# Patient Record
Sex: Female | Born: 1990 | Race: Black or African American | Hispanic: No | Marital: Single | Smoking: Never smoker
Health system: Southern US, Community
[De-identification: ages and names within clinical notes are randomized; demographics above are authoritative.]

## PROBLEM LIST (undated history)

## (undated) ENCOUNTER — Inpatient Hospital Stay (HOSPITAL_COMMUNITY): Payer: Self-pay

## (undated) DIAGNOSIS — G43909 Migraine, unspecified, not intractable, without status migrainosus: Secondary | ICD-10-CM

## (undated) HISTORY — PX: TONSILLECTOMY: SUR1361

## (undated) HISTORY — PX: ECTOPIC PREGNANCY SURGERY: SHX613

---
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
## 2011-01-16 ENCOUNTER — Emergency Department (HOSPITAL_BASED_OUTPATIENT_CLINIC_OR_DEPARTMENT_OTHER)
Admission: EM | Admit: 2011-01-16 | Discharge: 2011-01-16 | Disposition: A | Discharge status: Home / Self Care | Payer: Medicaid Other | Attending: Emergency Medicine | Admitting: Emergency Medicine

## 2011-01-16 ENCOUNTER — Emergency Department (INDEPENDENT_AMBULATORY_CARE_PROVIDER_SITE_OTHER): Payer: Medicaid Other

## 2011-01-16 DIAGNOSIS — R112 Nausea with vomiting, unspecified: Secondary | ICD-10-CM

## 2011-01-16 DIAGNOSIS — R109 Unspecified abdominal pain: Secondary | ICD-10-CM

## 2011-01-16 DIAGNOSIS — J45909 Unspecified asthma, uncomplicated: Secondary | ICD-10-CM | POA: Insufficient documentation

## 2011-01-16 DIAGNOSIS — G8929 Other chronic pain: Secondary | ICD-10-CM | POA: Insufficient documentation

## 2011-01-16 LAB — URINALYSIS, ROUTINE W REFLEX MICROSCOPIC
Glucose, UA: NEGATIVE mg/dL
Leukocytes, UA: NEGATIVE
Protein, ur: NEGATIVE mg/dL
Specific Gravity, Urine: 1.03 (ref 1.005–1.030)
Urobilinogen, UA: 0.2 mg/dL (ref 0.0–1.0)

## 2011-01-16 LAB — CBC
MCH: 27.7 pg (ref 26.0–34.0)
MCHC: 34.7 g/dL (ref 30.0–36.0)
Platelets: 280 10*3/uL (ref 150–400)
RDW: 12.6 % (ref 11.5–15.5)

## 2011-01-16 LAB — COMPREHENSIVE METABOLIC PANEL
ALT: 9 U/L (ref 0–35)
AST: 15 U/L (ref 0–37)
Calcium: 9.8 mg/dL (ref 8.4–10.5)
Creatinine, Ser: <0.47 mg/dL (ref 0.4–1.2)
Sodium: 139 mEq/L (ref 135–145)
Total Protein: 8.1 g/dL (ref 6.0–8.3)

## 2011-01-16 LAB — DIFFERENTIAL
Basophils Absolute: 0 10*3/uL (ref 0.0–0.1)
Basophils Relative: 0 % (ref 0–1)
Eosinophils Absolute: 0.1 10*3/uL (ref 0.0–0.7)
Eosinophils Relative: 1 % (ref 0–5)
Monocytes Absolute: 0.4 10*3/uL (ref 0.1–1.0)
Monocytes Relative: 6 % (ref 3–12)
Neutro Abs: 4.9 10*3/uL (ref 1.7–7.7)

## 2011-01-16 LAB — URINE MICROSCOPIC-ADD ON

## 2011-04-02 ENCOUNTER — Encounter: Payer: Self-pay | Admitting: *Deleted

## 2011-04-02 ENCOUNTER — Emergency Department (HOSPITAL_BASED_OUTPATIENT_CLINIC_OR_DEPARTMENT_OTHER)
Admission: EM | Admit: 2011-04-02 | Discharge: 2011-04-02 | Disposition: A | Discharge status: Home / Self Care | Payer: Medicaid Other | Attending: Emergency Medicine | Admitting: Emergency Medicine

## 2011-04-02 DIAGNOSIS — B373 Candidiasis of vulva and vagina: Secondary | ICD-10-CM | POA: Insufficient documentation

## 2011-04-02 DIAGNOSIS — A499 Bacterial infection, unspecified: Secondary | ICD-10-CM | POA: Insufficient documentation

## 2011-04-02 DIAGNOSIS — R350 Frequency of micturition: Secondary | ICD-10-CM | POA: Insufficient documentation

## 2011-04-02 DIAGNOSIS — B3731 Acute candidiasis of vulva and vagina: Secondary | ICD-10-CM | POA: Insufficient documentation

## 2011-04-02 DIAGNOSIS — N390 Urinary tract infection, site not specified: Secondary | ICD-10-CM

## 2011-04-02 DIAGNOSIS — N760 Acute vaginitis: Secondary | ICD-10-CM | POA: Insufficient documentation

## 2011-04-02 DIAGNOSIS — B9689 Other specified bacterial agents as the cause of diseases classified elsewhere: Secondary | ICD-10-CM | POA: Insufficient documentation

## 2011-04-02 LAB — WET PREP, GENITAL

## 2011-04-02 LAB — URINALYSIS, ROUTINE W REFLEX MICROSCOPIC
Glucose, UA: NEGATIVE mg/dL
Hgb urine dipstick: NEGATIVE
Ketones, ur: 15 mg/dL — AB
Protein, ur: NEGATIVE mg/dL
Urobilinogen, UA: 0.2 mg/dL (ref 0.0–1.0)

## 2011-04-02 LAB — URINE MICROSCOPIC-ADD ON

## 2011-04-02 MED ORDER — METRONIDAZOLE 500 MG PO TABS: 500.0000 mg | ORAL_TABLET | Freq: Two times a day (BID) | ORAL | Status: AC

## 2011-04-02 MED ORDER — FLUCONAZOLE 200 MG PO TABS: 200.0000 mg | ORAL_TABLET | Freq: Every day | ORAL | Status: AC

## 2011-04-02 MED ORDER — NITROFURANTOIN MONOHYD MACRO 100 MG PO CAPS: 100.0000 mg | ORAL_CAPSULE | Freq: Two times a day (BID) | ORAL | Status: AC

## 2011-04-02 NOTE — ED Provider Notes (Addendum)
History     CSN: [id number] Arrival date & time: 04/02/2011  2:57 PM  Chief Complaint  Patient presents with  . Urinary Frequency   HPI Comments: Pt states that she is having urinary frequency and white vaginal discharge  Patient is a 20 y.o. female presenting with frequency. The history is provided by the patient. No language interpreter was used.  Urinary Frequency This is a new problem. The current episode started yesterday. The problem occurs constantly. The problem has been unchanged. Associated symptoms include urinary symptoms. Pertinent negatives include no abdominal pain, fever, nausea or sore throat. The symptoms are aggravated by nothing. She has tried nothing for the symptoms.  Urinary Frequency This is a new problem. The current episode started yesterday. The problem occurs constantly. The problem has been unchanged. Pertinent negatives include no abdominal pain. The symptoms are aggravated by nothing. She has tried nothing for the symptoms.    Past Medical History  Diagnosis Date  . Arthritis     Past Surgical History  Procedure Date  . Tonsillectomy     No family history on file.  History  Substance Use Topics  . Smoking status: Not on file  . Smokeless tobacco: Not on file  . Alcohol Use:     OB History    Grav Para Term Preterm Abortions TAB SAB Ect Mult Living                  Review of Systems  Constitutional: Negative for fever.  HENT: Negative for sore throat.   Gastrointestinal: Negative for nausea and abdominal pain.  Genitourinary: Positive for frequency.  All other systems reviewed and are negative.    Physical Exam  BP 131/71  Pulse 84  Temp(Src) 98.5 F (36.9 C) (Oral)  Resp 16  Ht 5\' 2"  (1.575 m)  Wt 104 lb (47.174 kg)  BMI 19.02 kg/m2  SpO2 98%  LMP 03/23/2011  Physical Exam  Vitals reviewed. Constitutional: She is oriented to person, place, and time. She appears well-developed and well-nourished.  HENT:  Head:  Normocephalic.  Cardiovascular: Normal rate and regular rhythm.   Pulmonary/Chest: Effort normal and breath sounds normal.  Abdominal: Soft. Bowel sounds are normal.  Genitourinary: Cervix exhibits no motion tenderness. Vaginal discharge found.       White vaginal discharge  Musculoskeletal: Normal range of motion.  Neurological: She is alert and oriented to person, place, and time.  Skin: Skin is warm and dry.  Psychiatric: She has a normal mood and affect.    ED Course  Procedures Results for orders placed during the hospital encounter of 04/02/11  URINALYSIS, ROUTINE W REFLEX MICROSCOPIC      Component Value Range   Color, Urine YELLOW  YELLOW    Appearance CLOUDY (*) CLEAR    Specific Gravity, Urine 1.025  1.005 - 1.030    pH 6.0  5.0 - 8.0    Glucose, UA NEGATIVE  NEGATIVE (mg/dL)   Hgb urine dipstick NEGATIVE  NEGATIVE    Bilirubin Urine NEGATIVE  NEGATIVE    Ketones, ur 15 (*) NEGATIVE (mg/dL)   Protein, ur NEGATIVE  NEGATIVE (mg/dL)   Urobilinogen, UA 0.2  0.0 - 1.0 (mg/dL)   Nitrite NEGATIVE  NEGATIVE    Leukocytes, UA MODERATE (*) NEGATIVE   PREGNANCY, URINE      Component Value Range   Preg Test, Ur NEGATIVE    WET PREP, GENITAL      Component Value Range   Yeast, Wet Prep RARE (*)  NONE SEEN    Trich, Wet Prep NONE SEEN  NONE SEEN    Clue Cells, Wet Prep FEW (*) NONE SEEN    WBC, Wet Prep HPF POC FEW (*) NONE SEEN   URINE MICROSCOPIC-ADD ON      Component Value Range   Squamous Epithelial / LPF FEW (*) RARE    WBC, UA 7-10  <3 (WBC/hpf)   Bacteria, UA RARE  RARE    No results found.  MDM Pt afebrile, with vitals stable:will treat for a simple uti, bv and yeast      Teressa Lower, NP 04/02/11 1620  Teressa Lower, NP 04/02/11 1621  Medical screening examination/treatment/procedure(s) were conducted as a shared visit with non-physician practitioner(s) and myself.     Sunnie Nielsen, MD 04/02/11 2138  Sunnie Nielsen, MD 04/30/11 [phone]

## 2011-04-02 NOTE — ED Notes (Signed)
Pt reports burning, itching and white discharge x 2 days. Also wants to be checked for STD.

## 2011-04-04 LAB — GC/CHLAMYDIA PROBE AMP, GENITAL
Chlamydia, DNA Probe: POSITIVE — AB
GC Probe Amp, Genital: NEGATIVE

## 2011-04-05 NOTE — ED Notes (Signed)
Results received from [facility].  (+) Chlamydia.  No antibiotic treatment or Prescription given for STD.  Chart to MD office for review.  [facility] form attached. 

## 2011-08-22 ENCOUNTER — Emergency Department (HOSPITAL_BASED_OUTPATIENT_CLINIC_OR_DEPARTMENT_OTHER)
Admission: EM | Admit: 2011-08-22 | Discharge: 2011-08-22 | Disposition: A | Discharge status: Home / Self Care | Payer: Self-pay | Attending: Emergency Medicine | Admitting: Emergency Medicine

## 2011-08-22 ENCOUNTER — Encounter (HOSPITAL_BASED_OUTPATIENT_CLINIC_OR_DEPARTMENT_OTHER): Payer: Self-pay | Admitting: *Deleted

## 2011-08-22 DIAGNOSIS — B9789 Other viral agents as the cause of diseases classified elsewhere: Secondary | ICD-10-CM | POA: Insufficient documentation

## 2011-08-22 DIAGNOSIS — J029 Acute pharyngitis, unspecified: Secondary | ICD-10-CM | POA: Insufficient documentation

## 2011-08-22 DIAGNOSIS — B349 Viral infection, unspecified: Secondary | ICD-10-CM

## 2011-08-22 DIAGNOSIS — J45909 Unspecified asthma, uncomplicated: Secondary | ICD-10-CM | POA: Insufficient documentation

## 2011-08-22 MED ORDER — HYDROCOD POLST-CHLORPHEN POLST 10-8 MG/5ML PO LQCR
5.0000 mL | Freq: Two times a day (BID) | ORAL | Status: DC | PRN
Start: 2011-08-22 — End: 2012-01-01

## 2011-08-22 NOTE — ED Provider Notes (Signed)
History     CSN: [id number]  Arrival date & time 08/22/11  1239   First MD Initiated Contact with Patient 08/22/11 1406      Chief Complaint  Patient presents with  . Influenza    (Consider location/radiation/quality/duration/timing/severity/associated sxs/prior treatment) Patient is a 20 y.o. female presenting with flu symptoms. The history is provided by the patient. No language interpreter was used.  Influenza This is a new problem. The current episode started today. The problem occurs constantly. The problem has been unchanged. Associated symptoms include coughing and a sore throat. Pertinent negatives include no fever, rash or vomiting. The symptoms are aggravated by nothing. She has tried nothing for the symptoms.    Past Medical History  Diagnosis Date  . Asthma     Past Surgical History  Procedure Date  . Tonsillectomy     History reviewed. No pertinent family history.  History  Substance Use Topics  . Smoking status: Never Smoker   . Smokeless tobacco: Not on file  . Alcohol Use: No    OB History    Grav Para Term Preterm Abortions TAB SAB Ect Mult Living                  Review of Systems  Constitutional: Negative for fever.  HENT: Positive for sore throat.   Respiratory: Positive for cough.   Gastrointestinal: Negative for vomiting.  Skin: Negative for rash.  All other systems reviewed and are negative.    Allergies  Penicillins  Home Medications   Current Outpatient Rx  Name Route Sig Dispense Refill  . ALBUTEROL 90 MCG/ACT IN AERS Inhalation Inhale 2 puffs into the lungs once as needed. For shortness of breath     . HYDROCOD POLST-CHLORPHEN POLST 10-8 MG/5ML PO LQCR Oral Take 5 mLs by mouth every 12 (twelve) hours as needed. 140 mL 0  . CLINDAMYCIN HCL 150 MG PO CAPS Oral Take 150 mg by mouth 3 (three) times daily.      Azzie Roup ACE-ETH ESTRAD-FE 1-20 MG-MCG(24) PO TABS Oral Take 1 tablet by mouth daily.        BP 145/87  Pulse 103   Temp(Src) 98.2 F (36.8 C) (Oral)  Resp 18  Ht 5\' 1"  (1.549 m)  Wt 105 lb (47.628 kg)  BMI 19.84 kg/m2  SpO2 100%  LMP 08/01/2011  Physical Exam  Nursing note and vitals reviewed. Constitutional: She is oriented to person, place, and time. She appears well-developed and well-nourished.  HENT:  Head: Normocephalic and atraumatic.  Right Ear: External ear normal.  Left Ear: External ear normal.  Nose: Rhinorrhea present.  Mouth/Throat: Posterior oropharyngeal erythema present.  Eyes: Conjunctivae and EOM are normal.  Cardiovascular: Normal rate and regular rhythm.   Pulmonary/Chest: Effort normal and breath sounds normal.  Musculoskeletal: Normal range of motion.  Neurological: She is alert and oriented to person, place, and time.    ED Course  Procedures (including critical care time)   Labs Reviewed  PREGNANCY, URINE   No results found.   1. Viral illness       MDM  Lungs clear:symptom likely viral:will treat with cough medication:don't think antibiotics or imaging are needed at this time        Teressa Lower, NP 08/22/11 1445

## 2011-08-22 NOTE — ED Notes (Signed)
Care plan and follow up hydration reviewed

## 2011-08-22 NOTE — ED Notes (Signed)
Pt states she has had cough, H/A, sore throat, chest "burns", SHOB, eyes watery x 3 days.

## 2011-08-23 NOTE — ED Provider Notes (Signed)
Medical screening examination/treatment/procedure(s) were performed by non-physician practitioner and as supervising physician I was immediately available for consultation/collaboration.  Nelma Phagan, MD 08/23/11 2211 

## 2011-11-13 ENCOUNTER — Encounter (HOSPITAL_BASED_OUTPATIENT_CLINIC_OR_DEPARTMENT_OTHER): Payer: Self-pay

## 2011-11-13 ENCOUNTER — Emergency Department (HOSPITAL_BASED_OUTPATIENT_CLINIC_OR_DEPARTMENT_OTHER)
Admission: EM | Admit: 2011-11-13 | Discharge: 2011-11-13 | Disposition: A | Discharge status: Home / Self Care | Payer: Self-pay | Attending: Emergency Medicine | Admitting: Emergency Medicine

## 2011-11-13 DIAGNOSIS — J45909 Unspecified asthma, uncomplicated: Secondary | ICD-10-CM | POA: Insufficient documentation

## 2011-11-13 DIAGNOSIS — N939 Abnormal uterine and vaginal bleeding, unspecified: Secondary | ICD-10-CM

## 2011-11-13 DIAGNOSIS — N898 Other specified noninflammatory disorders of vagina: Secondary | ICD-10-CM | POA: Insufficient documentation

## 2011-11-13 LAB — URINALYSIS, ROUTINE W REFLEX MICROSCOPIC
Bilirubin Urine: NEGATIVE
Ketones, ur: NEGATIVE mg/dL
Nitrite: NEGATIVE
Protein, ur: NEGATIVE mg/dL
pH: 8 (ref 5.0–8.0)

## 2011-11-13 LAB — URINE MICROSCOPIC-ADD ON

## 2011-11-13 MED ORDER — MEDROXYPROGESTERONE ACETATE 5 MG PO TABS: 10.0000 mg | ORAL_TABLET | Freq: Every day | ORAL | Status: DC

## 2011-11-13 NOTE — ED Notes (Signed)
Pt c/o heavy menstrual flow for past 2 days.   Pt states she stopped BC pills about 6-7 months ago and has had heavy flow last month and now again for past 2 days.

## 2011-11-13 NOTE — Discharge Instructions (Signed)

## 2011-11-13 NOTE — ED Provider Notes (Signed)
History     CSN: [id number]  Arrival date & time 11/13/11  1524   First MD Initiated Contact with Patient 11/13/11 1547      Chief Complaint  Patient presents with  . Menstrual Problem    (Consider location/radiation/quality/duration/timing/severity/associated sxs/prior treatment) HPI Comments: Pt has had irregular periods for last 6-7 months after stopping birth control.  Have been unusually light.  Yesterday, started bleeding and has been heavier than normal.  Has some lower intermittent abd cramping, but no pain.  No dizziness.  No abnormal discharge.  Patient is a 21 y.o. female presenting with vaginal bleeding. The history is provided by the patient.  Vaginal Bleeding This is a new problem. The current episode started yesterday. The problem occurs constantly. The problem has been gradually worsening. Pertinent negatives include no chest pain, no abdominal pain, no headaches and no shortness of breath. The symptoms are aggravated by nothing. The symptoms are relieved by nothing. She has tried nothing for the symptoms.    Past Medical History  Diagnosis Date  . Asthma     Past Surgical History  Procedure Date  . Tonsillectomy     No family history on file.  History  Substance Use Topics  . Smoking status: Never Smoker   . Smokeless tobacco: Not on file  . Alcohol Use: Yes    OB History    Grav Para Term Preterm Abortions TAB SAB Ect Mult Living                  Review of Systems  Constitutional: Negative for fever, chills, diaphoresis and fatigue.  HENT: Negative for congestion, rhinorrhea and sneezing.   Eyes: Negative.   Respiratory: Negative for cough, chest tightness and shortness of breath.   Cardiovascular: Negative for chest pain and leg swelling.  Gastrointestinal: Negative for nausea, vomiting, abdominal pain, diarrhea and blood in stool.  Genitourinary: Positive for vaginal bleeding. Negative for frequency, hematuria, flank pain, vaginal discharge  and difficulty urinating.  Musculoskeletal: Negative for back pain and arthralgias.  Skin: Negative for rash.  Neurological: Negative for dizziness, speech difficulty, weakness, numbness and headaches.    Allergies  Penicillins  Home Medications   Current Outpatient Rx  Name Route Sig Dispense Refill  . ALBUTEROL 90 MCG/ACT IN AERS Inhalation Inhale 2 puffs into the lungs once as needed. For shortness of breath     . BISMUTH SUBSALICYLATE 262 MG/15ML PO SUSP Oral Take 30 mLs by mouth every 6 (six) hours as needed. Patient used this medication for upset stomach.    Marland Kitchen HYDROCOD POLST-CPM POLST ER 10-8 MG/5ML PO LQCR Oral Take 5 mLs by mouth every 12 (twelve) hours as needed. 140 mL 0  . IBUPROFEN 200 MG PO TABS Oral Take 400 mg by mouth every 6 (six) hours as needed. Patient used this medication for a headache.    Marland Kitchen CLINDAMYCIN HCL 150 MG PO CAPS Oral Take 150 mg by mouth 3 (three) times daily.      Marland Kitchen MEDROXYPROGESTERONE ACETATE 5 MG PO TABS Oral Take 2 tablets (10 mg total) by mouth daily. 10 tablet 0  . NORETHIN ACE-ETH ESTRAD-FE 1-20 MG-MCG(24) PO TABS Oral Take 1 tablet by mouth daily.        BP 126/81  Pulse 67  Temp(Src) 98.4 F (36.9 C) (Oral)  Resp 16  Ht 5\' 1"  (1.549 m)  Wt 100 lb (45.36 kg)  BMI 18.89 kg/m2  SpO2 100%  LMP 11/11/2011  Physical Exam  Constitutional: She  is oriented to person, place, and time. She appears well-developed and well-nourished.  HENT:  Head: Normocephalic and atraumatic.  Eyes: Pupils are equal, round, and reactive to light.  Neck: Normal range of motion. Neck supple.  Cardiovascular: Normal rate, regular rhythm and normal heart sounds.   Pulmonary/Chest: Effort normal and breath sounds normal. No respiratory distress. She has no wheezes. She has no rales. She exhibits no tenderness.  Abdominal: Soft. Bowel sounds are normal. There is no tenderness. There is no rebound and no guarding.  Genitourinary:       Small amount of blood in vault.   No discharge.  No CMT/adnexal tenderness.  Musculoskeletal: Normal range of motion. She exhibits no edema.  Lymphadenopathy:    She has no cervical adenopathy.  Neurological: She is alert and oriented to person, place, and time.  Skin: Skin is warm and dry. No rash noted.  Psychiatric: She has a normal mood and affect.    ED Course  Procedures (including critical care time)  Results for orders placed during the hospital encounter of 11/13/11  URINALYSIS, ROUTINE W REFLEX MICROSCOPIC      Component Value Range   Color, Urine YELLOW  YELLOW    APPearance CLEAR  CLEAR    Specific Gravity, Urine 1.019  1.005 - 1.030    pH 8.0  5.0 - 8.0    Glucose, UA NEGATIVE  NEGATIVE (mg/dL)   Hgb urine dipstick SMALL (*) NEGATIVE    Bilirubin Urine NEGATIVE  NEGATIVE    Ketones, ur NEGATIVE  NEGATIVE (mg/dL)   Protein, ur NEGATIVE  NEGATIVE (mg/dL)   Urobilinogen, UA 0.2  0.0 - 1.0 (mg/dL)   Nitrite NEGATIVE  NEGATIVE    Leukocytes, UA NEGATIVE  NEGATIVE   PREGNANCY, URINE      Component Value Range   Preg Test, Ur NEGATIVE  NEGATIVE   URINE MICROSCOPIC-ADD ON      Component Value Range   Squamous Epithelial / LPF RARE  RARE    RBC / HPF 3-6  <3 (RBC/hpf)   Bacteria, UA RARE  RARE    No results found.  No results found.   1. Vaginal bleeding       MDM  Pt does not want to start back on OCP.  Will start short course of provera.  Refer to ob/gyn if no improvement.  Advised to return here for worsening bleeding.        Rolan Bucco, MD 11/13/11 [phone]

## 2012-01-01 ENCOUNTER — Encounter (HOSPITAL_BASED_OUTPATIENT_CLINIC_OR_DEPARTMENT_OTHER): Payer: Self-pay

## 2012-01-01 ENCOUNTER — Emergency Department (HOSPITAL_BASED_OUTPATIENT_CLINIC_OR_DEPARTMENT_OTHER)
Admission: EM | Admit: 2012-01-01 | Discharge: 2012-01-01 | Disposition: A | Discharge status: Home / Self Care | Payer: Self-pay | Attending: Emergency Medicine | Admitting: Emergency Medicine

## 2012-01-01 DIAGNOSIS — L738 Other specified follicular disorders: Secondary | ICD-10-CM | POA: Insufficient documentation

## 2012-01-01 DIAGNOSIS — L739 Follicular disorder, unspecified: Secondary | ICD-10-CM

## 2012-01-01 DIAGNOSIS — A499 Bacterial infection, unspecified: Secondary | ICD-10-CM | POA: Insufficient documentation

## 2012-01-01 DIAGNOSIS — J45909 Unspecified asthma, uncomplicated: Secondary | ICD-10-CM | POA: Insufficient documentation

## 2012-01-01 DIAGNOSIS — B9689 Other specified bacterial agents as the cause of diseases classified elsewhere: Secondary | ICD-10-CM | POA: Insufficient documentation

## 2012-01-01 DIAGNOSIS — R300 Dysuria: Secondary | ICD-10-CM | POA: Insufficient documentation

## 2012-01-01 DIAGNOSIS — N760 Acute vaginitis: Secondary | ICD-10-CM | POA: Insufficient documentation

## 2012-01-01 LAB — URINALYSIS, ROUTINE W REFLEX MICROSCOPIC
Hgb urine dipstick: NEGATIVE
Nitrite: NEGATIVE
Specific Gravity, Urine: 1.026 (ref 1.005–1.030)
Urobilinogen, UA: 0.2 mg/dL (ref 0.0–1.0)
pH: 5.5 (ref 5.0–8.0)

## 2012-01-01 LAB — WET PREP, GENITAL: Trich, Wet Prep: NONE SEEN

## 2012-01-01 LAB — URINE MICROSCOPIC-ADD ON

## 2012-01-01 LAB — PREGNANCY, URINE: Preg Test, Ur: NEGATIVE

## 2012-01-01 MED ORDER — CEFTRIAXONE SODIUM 250 MG IJ SOLR
250.0000 mg | Freq: Once | INTRAMUSCULAR | Status: AC
  Administered 2012-01-01: 250 mg via INTRAMUSCULAR
  Filled 2012-01-01: qty 250

## 2012-01-01 MED ORDER — LIDOCAINE HCL (PF) 1 % IJ SOLN
INTRAMUSCULAR | Status: AC
  Administered 2012-01-01: 5 mL
  Filled 2012-01-01: qty 5

## 2012-01-01 MED ORDER — METRONIDAZOLE 500 MG PO TABS: 500.0000 mg | ORAL_TABLET | Freq: Two times a day (BID) | ORAL | Status: AC

## 2012-01-01 MED ORDER — DOXYCYCLINE HYCLATE 100 MG PO TABS
100.0000 mg | ORAL_TABLET | Freq: Once | ORAL | Status: AC
  Administered 2012-01-01: 100 mg via ORAL
  Filled 2012-01-01: qty 1

## 2012-01-01 MED ORDER — DOXYCYCLINE MONOHYDRATE 150 MG PO CAPS: 1.0000 | ORAL_CAPSULE | Freq: Two times a day (BID) | ORAL | Status: DC

## 2012-01-01 NOTE — Discharge Instructions (Signed)
Bacterial Vaginosis Bacterial vaginosis (BV) is a vaginal infection where the normal balance of bacteria in the vagina is disrupted. The normal balance is then replaced by an overgrowth of certain bacteria. There are several different kinds of bacteria that can cause BV. BV is the most common vaginal infection in women of childbearing age. CAUSES   The cause of BV is not fully understood. BV develops when there is an increase or imbalance of harmful bacteria.   Some activities or behaviors can upset the normal balance of bacteria in the vagina and put women at increased risk including:   Having a new sex partner or multiple sex partners.   Douching.   Using an intrauterine device (IUD) for contraception.   It is not clear what role sexual activity plays in the development of BV. However, women that have never had sexual intercourse are rarely infected with BV.  Women do not get BV from toilet seats, bedding, swimming pools or from touching objects around them.  SYMPTOMS   Grey vaginal discharge.   A fish-like odor with discharge, especially after sexual intercourse.   Itching or burning of the vagina and vulva.   Burning or pain with urination.   Some women have no signs or symptoms at all.  DIAGNOSIS  Your caregiver must examine the vagina for signs of BV. Your caregiver will perform lab tests and look at the sample of vaginal fluid through a microscope. They will look for bacteria and abnormal cells (clue cells), a pH test higher than 4.5, and a positive amine test all associated with BV.  RISKS AND COMPLICATIONS   Pelvic inflammatory disease (PID).   Infections following gynecology surgery.   Developing HIV.   Developing herpes virus.  TREATMENT  Sometimes BV will clear up without treatment. However, all women with symptoms of BV should be treated to avoid complications, especially if gynecology surgery is planned. Female partners generally do not need to be treated. However,  BV may spread between female sex partners so treatment is helpful in preventing a recurrence of BV.   BV may be treated with antibiotics. The antibiotics come in either pill or vaginal cream forms. Either can be used with nonpregnant or pregnant women, but the recommended dosages differ. These antibiotics are not harmful to the baby.   BV can recur after treatment. If this happens, a second round of antibiotics will often be prescribed.   Treatment is important for pregnant women. If not treated, BV can cause a premature delivery, especially for a pregnant woman who had a premature birth in the past. All pregnant women who have symptoms of BV should be checked and treated.   For chronic reoccurrence of BV, treatment with a type of prescribed gel vaginally twice a week is helpful.  HOME CARE INSTRUCTIONS   Finish all medication as directed by your caregiver.   Do not have sex until treatment is completed.   Tell your sexual partner that you have a vaginal infection. They should see their caregiver and be treated if they have problems, such as a mild rash or itching.   Practice safe sex. Use condoms. Only have 1 sex partner.  PREVENTION  Basic prevention steps can help reduce the risk of upsetting the natural balance of bacteria in the vagina and developing BV:  Do not have sexual intercourse (be abstinent).   Do not douche.   Use all of the medicine prescribed for treatment of BV, even if the signs and symptoms go away.     Tell your sex partner if you have BV. That way, they can be treated, if needed, to prevent reoccurrence.  SEEK MEDICAL CARE IF:   Your symptoms are not improving after 3 days of treatment.   You have increased discharge, pain, or fever.  MAKE SURE YOU:   Understand these instructions.   Will watch your condition.   Will get help right away if you are not doing well or get worse.  FOR MORE INFORMATION  Division of STD Prevention (DSTDP), Centers for Disease  Control and Prevention: [employer]Apps.[employer] American [employer] Health Association (ASHA): www.ashastd.org  Document Released: 08/10/2005 Document Revised: 07/30/2011 Document Reviewed: 01/31/2009 [facility] Patient Information 2012 [place], [place].Folliculitis  Folliculitis is an infection and inflammation of the hair follicles. Hair follicles become red and irritated. This inflammation is usually caused by bacteria. The bacteria thrive in warm, moist environments. This condition can be seen anywhere on the body.  CAUSES The most common cause of folliculitis is an infection by germs (bacteria). Fungal and viral infections can also cause the condition. Viral infections may be more common in people whose bodies are unable to fight disease well (weakened immune systems). Examples include people with:  AIDS.   An organ transplant.   Cancer.  People with depressed immune systems, diabetes, or obesity, have a greater risk of getting folliculitis than the general population. Certain chemicals, especially oils and tars, also can cause folliculitis. SYMPTOMS  An early sign of folliculitis is a small, white or yellow pus-filled, itchy lesion (pustule). These lesions appear on a red, inflamed follicle. They are usually less than 5 mm (.20 inches).   The most likely starting points are the scalp, thighs, legs, back and buttocks. Folliculitis is also frequently found in areas of repeated shaving.   When an infection of the follicle goes deeper, it becomes a boil or furuncle. A group of closely packed boils create a larger lesion (a carbuncle). These sores (lesions) tend to occur in hairy, sweaty areas of the body.  TREATMENT   A doctor who specializes in skin problems (dermatologists) treats mild cases of folliculitis with antiseptic washes.   They also use a skin application which kills germs (topical antibiotics). Tea tree oil is a good topical antiseptic as well. It can be found at a health food store. A small  percentage of individuals may develop an allergy to the tea tree oil.   Mild to moderate boils respond well to warm water compresses applied three times daily.   In some cases, oral antibiotics should be taken with the skin treatment.   If lesions contain large quantities of pus or fluid, your caregiver may drain them. This allows the topical antibiotics to get to the affected areas better.   Stubborn cases of folliculitis may respond to laser hair removal. This process uses a high intensity light beam (a laser) to destroy the follicle and reduces the scarring from folliculitis. After laser hair removal, hair will no longer grow in the laser treated area.  Patients with long-lasting folliculitis need to find out where the infection is coming from. Germs can live in the nostrils of the patient. This can trigger an outbreak now and then. Sometimes the bacteria live in the nostrils of a family member. This person does not develop the disorder but they repeatedly re-expose others to the germ. To break the cycle of recurrence in the patient, the family member must also undergo treatment. PREVENTION   Individuals who are predisposed to folliculitis should be extremely careful  about personal hygiene.   Application of antiseptic washes may help prevent recurrences.   A topical antibiotic cream, mupirocin (Bactroban), has been effective at reducing bacteria in the nostrils. It is applied inside the nose with your little finger. This is done twice daily for a week. Then it is repeated every 6 months.   Because follicle disorders tend to come back, patients must receive follow-up care. Your caregiver may be able to recognize a recurrence before it becomes severe.  SEEK IMMEDIATE MEDICAL CARE IF:   You develop redness, swelling, or increasing pain in the area.   You have a fever.   You are not improving with treatment or are getting worse.   You have any other questions or concerns.  Document  Released: 10/19/2001 Document Revised: 07/30/2011 Document Reviewed: 08/15/2008 [facility] Patient Information 2012 [place], [place].

## 2012-01-01 NOTE — ED Provider Notes (Signed)
History     CSN: [id number]  Arrival date & time 01/01/12  1451   First MD Initiated Contact with Patient 01/01/12 1510      Chief Complaint  Patient presents with  . Dysuria    (Consider location/radiation/quality/duration/timing/severity/associated sxs/prior treatment) HPI  Patient complaining of dysuria described as burning with urintation for three days.  Pain is located posterior vainal area with urination or palpation.  Pain is severe with urination at 8/10, now 6/10.  Area is tender and patient put baby powder on yesterday and thinks there are bumps.  Patient with history of five sexual partners and one current.  Patient with history of gc chlamydia last summer.  g0 lmp April 17.  Patient not using bcp.  No vaginal discharge.  Past Medical History  Diagnosis Date  . Asthma     Past Surgical History  Procedure Date  . Tonsillectomy     No family history on file.  History  Substance Use Topics  . Smoking status: Never Smoker   . Smokeless tobacco: Not on file  . Alcohol Use: Yes    OB History    Grav Para Term Preterm Abortions TAB SAB Ect Mult Living                  Review of Systems  All other systems reviewed and are negative.    Allergies  Penicillins  Home Medications   Current Outpatient Rx  Name Route Sig Dispense Refill  . ALBUTEROL 90 MCG/ACT IN AERS Inhalation Inhale 2 puffs into the lungs once as needed. For shortness of breath     . BISMUTH SUBSALICYLATE 262 MG/15ML PO SUSP Oral Take 30 mLs by mouth every 6 (six) hours as needed. Patient used this medication for upset stomach.    Marland Kitchen HYDROCOD POLST-CPM POLST ER 10-8 MG/5ML PO LQCR Oral Take 5 mLs by mouth every 12 (twelve) hours as needed. 140 mL 0  . CLINDAMYCIN HCL 150 MG PO CAPS Oral Take 150 mg by mouth 3 (three) times daily.      . IBUPROFEN 200 MG PO TABS Oral Take 400 mg by mouth every 6 (six) hours as needed. Patient used this medication for a headache.    Marland Kitchen MEDROXYPROGESTERONE  ACETATE 5 MG PO TABS Oral Take 2 tablets (10 mg total) by mouth daily. 10 tablet 0  . NORETHIN ACE-ETH ESTRAD-FE 1-20 MG-MCG(24) PO TABS Oral Take 1 tablet by mouth daily.        BP 137/70  Pulse 73  Temp(Src) 98.8 F (37.1 C) (Oral)  Resp 16  Ht 5\' 2"  (1.575 m)  Wt 105 lb (47.628 kg)  BMI 19.20 kg/m2  SpO2 100%  LMP 12/09/2011  Physical Exam  Nursing note and vitals reviewed. Constitutional: She is oriented to person, place, and time. She appears well-developed and well-nourished.  HENT:  Head: Normocephalic and atraumatic.  Eyes: Conjunctivae and EOM are normal. Pupils are equal, round, and reactive to light.  Neck: Normal range of motion. Neck supple.  Cardiovascular: Normal rate and regular rhythm.   Pulmonary/Chest: Effort normal and breath sounds normal.  Abdominal: Soft. Bowel sounds are normal.  Genitourinary:    There is tenderness around the vagina. No vaginal discharge found.  Musculoskeletal: Normal range of motion.  Neurological: She is alert and oriented to person, place, and time. She has normal reflexes.  Skin: Skin is warm.  Psychiatric: She has a normal mood and affect.    ED Course  Procedures (including critical  care time)   Labs Reviewed  PREGNANCY, URINE  URINALYSIS, ROUTINE W REFLEX MICROSCOPIC   No results found.   No diagnosis found.    Patient's      Hilario Quarry, MD 01/01/12 [id number]

## 2012-01-01 NOTE — ED Notes (Signed)
Pt c/o dysuria.  Pt states onset 2 days ago.  Pt denies increased frequency, hematuria, discharge, itching or odor.

## 2012-01-02 LAB — GC/CHLAMYDIA PROBE AMP, GENITAL: GC Probe Amp, Genital: NEGATIVE

## 2012-01-03 ENCOUNTER — Encounter (HOSPITAL_BASED_OUTPATIENT_CLINIC_OR_DEPARTMENT_OTHER): Payer: Self-pay | Admitting: *Deleted

## 2012-01-03 ENCOUNTER — Emergency Department (HOSPITAL_BASED_OUTPATIENT_CLINIC_OR_DEPARTMENT_OTHER)
Admission: EM | Admit: 2012-01-03 | Discharge: 2012-01-03 | Disposition: A | Discharge status: Home / Self Care | Payer: Self-pay | Attending: Emergency Medicine | Admitting: Emergency Medicine

## 2012-01-03 DIAGNOSIS — J45909 Unspecified asthma, uncomplicated: Secondary | ICD-10-CM | POA: Insufficient documentation

## 2012-01-03 DIAGNOSIS — Z79899 Other long term (current) drug therapy: Secondary | ICD-10-CM | POA: Insufficient documentation

## 2012-01-03 DIAGNOSIS — N949 Unspecified condition associated with female genital organs and menstrual cycle: Secondary | ICD-10-CM | POA: Insufficient documentation

## 2012-01-03 DIAGNOSIS — A6000 Herpesviral infection of urogenital system, unspecified: Secondary | ICD-10-CM | POA: Insufficient documentation

## 2012-01-03 DIAGNOSIS — N898 Other specified noninflammatory disorders of vagina: Secondary | ICD-10-CM | POA: Insufficient documentation

## 2012-01-03 MED ORDER — BENZTROPINE MESYLATE 1 MG/ML IJ SOLN: 1.0000 mg | Freq: Once | INTRAMUSCULAR | Status: DC

## 2012-01-03 MED ORDER — ACYCLOVIR 200 MG PO CAPS: 200.0000 mg | ORAL_CAPSULE | ORAL | Status: DC

## 2012-01-03 MED ORDER — ACYCLOVIR 200 MG PO CAPS: 200.0000 mg | ORAL_CAPSULE | ORAL | Status: AC

## 2012-01-03 MED ORDER — HYDROCODONE-ACETAMINOPHEN 5-325 MG PO TABS: 2.0000 | ORAL_TABLET | ORAL | Status: AC | PRN

## 2012-01-03 NOTE — ED Notes (Signed)
Pt seen here on Friday and placed on Doxy and Metro. Now c/o rash to body and "area is worse"

## 2012-01-03 NOTE — ED Provider Notes (Signed)
History     CSN: [id number]  Arrival date & time 01/03/12  1428   First MD Initiated Contact with Patient 01/03/12 1520      Chief Complaint  Patient presents with  . Medication Reaction    (Consider location/radiation/quality/duration/timing/severity/associated sxs/prior treatment) Patient is a 21 y.o. female presenting with vaginal discharge. The history is provided by the patient. No language interpreter was used.  Vaginal Discharge This is a new problem. The current episode started in the past 7 days. The problem occurs constantly. The problem has been gradually worsening. Treatments tried: antibiotics. The treatment provided no relief.  Pt reports she has been on flagyl and doxycycline.   Pt reports she now has a painful rash.   Past Medical History  Diagnosis Date  . Asthma     Past Surgical History  Procedure Date  . Tonsillectomy     History reviewed. No pertinent family history.  History  Substance Use Topics  . Smoking status: Never Smoker   . Smokeless tobacco: Not on file  . Alcohol Use: Yes    OB History    Grav Para Term Preterm Abortions TAB SAB Ect Mult Living                  Review of Systems  Unable to perform ROS Genitourinary: Positive for vaginal discharge and vaginal pain.  All other systems reviewed and are negative.    Allergies  Penicillins and Doxycycline  Home Medications   Current Outpatient Rx  Name Route Sig Dispense Refill  . METRONIDAZOLE 500 MG PO TABS Oral Take 1 tablet (500 mg total) by mouth 2 (two) times daily. 14 tablet 0  . ALBUTEROL 90 MCG/ACT IN AERS Inhalation Inhale 2 puffs into the lungs once as needed. For shortness of breath    . DOXYCYCLINE MONOHYDRATE 150 MG PO CAPS Oral Take 1 capsule (150 mg total) by mouth 2 (two) times daily. 20 each 0    BP 144/84  Pulse 54  Temp(Src) 98.1 F (36.7 C) (Oral)  Resp 14  Ht 5\' 2"  (1.575 m)  Wt 100 lb (45.36 kg)  BMI 18.29 kg/m2  SpO2 98%  LMP  12/09/2011  Physical Exam  Nursing note and vitals reviewed. Constitutional: She appears well-developed.  HENT:  Head: Normocephalic.  Cardiovascular: Normal rate.   Pulmonary/Chest: Effort normal.  Abdominal: Soft.  Genitourinary:       Multiple herpetic lesions,    Neurological: She is alert.  Skin: Skin is warm.    ED Course  Procedures (including critical care time)   Labs Reviewed  HERPES SIMPLEX VIRUS CULTURE   No results found.   No diagnosis found.    MDM  Gc/ct/rpr negative.  Pt advised of results.   Pt placed on acyclovir and hydrocodone        Lonia Skinner [place], [place] 01/03/12 1551

## 2012-01-03 NOTE — Discharge Instructions (Signed)
Genital Herpes Genital herpes is a sexually transmitted disease. This means that it is a disease passed by having sex with an infected person. There is no cure for genital herpes. The time between attacks can be months to years. The virus may live in a person but produce no problems (symptoms). This infection can be passed to a baby as it travels down the birth canal (vagina). In a newborn, this can cause central nervous system damage, eye damage, or even death. The virus that causes genital herpes is usually HSV-2 virus. The virus that causes oral herpes is usually HSV-1. The diagnosis (learning what is wrong) is made through culture results. SYMPTOMS  Usually symptoms of pain and itching begin a few days to a week after contact. It first appears as small blisters that progress to small painful ulcers which then scab over and heal after several days. It affects the outer genitalia, birth canal, cervix, penis, anal area, buttocks, and thighs. HOME CARE INSTRUCTIONS   Keep ulcerated areas dry and clean.   Take medications as directed. Antiviral medications can speed up healing. They will not prevent recurrences or cure this infection. These medications can also be taken for suppression if there are frequent recurrences.   While the infection is active, it is contagious. Avoid all sexual contact during active infections.   Condoms may help prevent spread of the herpes virus.   Practice safe sex.   Wash your hands thoroughly after touching the genital area.   Avoid touching your eyes after touching your genital area.   Inform your caregiver if you have had genital herpes and become pregnant. It is your responsibility to insure a safe outcome for your baby in this pregnancy.   Only take over-the-counter or prescription medicines for pain, discomfort, or fever as directed by your caregiver.  SEEK MEDICAL CARE IF:   You have a recurrence of this infection.   You do not respond to medications and  are not improving.   You have new sources of pain or discharge which have changed from the original infection.   You have an oral temperature above 102 F (38.9 C).   You develop abdominal pain.   You develop eye pain or signs of eye infection.  Document Released: 08/07/2000 Document Revised: 07/30/2011 Document Reviewed: 08/28/2009 [employer] Patient Information 2012 [employer]. 

## 2012-01-03 NOTE — ED Provider Notes (Signed)
History/physical exam/procedure(s) were performed by non-physician practitioner and as supervising physician I was immediately available for consultation/collaboration. I have reviewed all notes and am in agreement with care and plan.   Gedeon Brandow S Daran Favaro, MD 01/03/12 1941 

## 2012-01-06 LAB — HERPES SIMPLEX VIRUS CULTURE

## 2012-01-07 NOTE — ED Notes (Signed)
+   Herpes Patient treated with Acyclovir. Call and notify patient.

## 2012-04-05 ENCOUNTER — Emergency Department (HOSPITAL_BASED_OUTPATIENT_CLINIC_OR_DEPARTMENT_OTHER)
Admission: EM | Admit: 2012-04-05 | Discharge: 2012-04-05 | Disposition: A | Discharge status: Home / Self Care | Payer: Self-pay | Attending: Emergency Medicine | Admitting: Emergency Medicine

## 2012-04-05 ENCOUNTER — Emergency Department (HOSPITAL_BASED_OUTPATIENT_CLINIC_OR_DEPARTMENT_OTHER): Payer: Self-pay

## 2012-04-05 ENCOUNTER — Encounter (HOSPITAL_BASED_OUTPATIENT_CLINIC_OR_DEPARTMENT_OTHER): Payer: Self-pay

## 2012-04-05 DIAGNOSIS — S93401A Sprain of unspecified ligament of right ankle, initial encounter: Secondary | ICD-10-CM

## 2012-04-05 DIAGNOSIS — S93409A Sprain of unspecified ligament of unspecified ankle, initial encounter: Secondary | ICD-10-CM | POA: Insufficient documentation

## 2012-04-05 DIAGNOSIS — IMO0002 Reserved for concepts with insufficient information to code with codable children: Secondary | ICD-10-CM | POA: Insufficient documentation

## 2012-04-05 DIAGNOSIS — S80819A Abrasion, unspecified lower leg, initial encounter: Secondary | ICD-10-CM

## 2012-04-05 DIAGNOSIS — J45909 Unspecified asthma, uncomplicated: Secondary | ICD-10-CM | POA: Insufficient documentation

## 2012-04-05 MED ORDER — OXYCODONE-ACETAMINOPHEN 5-325 MG PO TABS: 1.0000 | ORAL_TABLET | ORAL | Status: AC | PRN

## 2012-04-05 NOTE — ED Notes (Signed)
Pt requested dressing on right leg before placing ankle brace. Applied bacitracin ointment and gauze.
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
## 2012-04-05 NOTE — ED Notes (Signed)
Dressings to right and left leg removed by pt.

## 2012-05-10 ENCOUNTER — Encounter (HOSPITAL_BASED_OUTPATIENT_CLINIC_OR_DEPARTMENT_OTHER): Payer: Self-pay | Admitting: *Deleted

## 2012-05-10 ENCOUNTER — Emergency Department (HOSPITAL_BASED_OUTPATIENT_CLINIC_OR_DEPARTMENT_OTHER)
Admission: EM | Admit: 2012-05-10 | Discharge: 2012-05-10 | Disposition: A | Discharge status: Home / Self Care | Payer: Self-pay | Attending: Emergency Medicine | Admitting: Emergency Medicine

## 2012-05-10 DIAGNOSIS — B86 Scabies: Secondary | ICD-10-CM

## 2012-05-10 DIAGNOSIS — Z880 Allergy status to penicillin: Secondary | ICD-10-CM | POA: Insufficient documentation

## 2012-05-10 DIAGNOSIS — R21 Rash and other nonspecific skin eruption: Secondary | ICD-10-CM | POA: Insufficient documentation

## 2012-05-10 DIAGNOSIS — J45909 Unspecified asthma, uncomplicated: Secondary | ICD-10-CM | POA: Insufficient documentation

## 2012-05-10 MED ORDER — DIPHENHYDRAMINE HCL 25 MG PO TABS: 25.0000 mg | ORAL_TABLET | Freq: Four times a day (QID) | ORAL | Status: DC

## 2012-05-10 MED ORDER — PERMETHRIN 5 % EX CREA: TOPICAL_CREAM | CUTANEOUS | Status: DC

## 2012-05-10 NOTE — ED Provider Notes (Signed)
History     CSN: [id number]  Arrival date & time 05/10/12  1517   First MD Initiated Contact with Patient 05/10/12 1547      Chief Complaint  Patient presents with  . Rash    HPI Patient presents to the emergency room with complaints of a rash that started in the last day or so. It is located on her hands and between her fingers and extends up towards her forearms. Noticed a few small bumps. The rash is itching but not painful. She has not had any trouble with fever or, swelling, nausea or vomiting.  Patient has not tried any treatment at home.  No ill contacts.  No foreign travel.  No pets. Past Medical History  Diagnosis Date  . Asthma     Past Surgical History  Procedure Date  . Tonsillectomy     No family history on file.  History  Substance Use Topics  . Smoking status: Never Smoker   . Smokeless tobacco: Not on file  . Alcohol Use: Yes    OB History    Grav Para Term Preterm Abortions TAB SAB Ect Mult Living                  Review of Systems  All other systems reviewed and are negative.    Allergies  Penicillins and Doxycycline  Home Medications  No current outpatient prescriptions on file.  BP 141/75  Pulse 80  Temp 98.6 F (37 C) (Oral)  Resp 20  Ht 5\' 2"  (1.575 m)  Wt 100 lb (45.36 kg)  BMI 18.29 kg/m2  SpO2 100%  LMP 04/19/2012  Physical Exam  Nursing note and vitals reviewed. Constitutional: She appears well-developed and well-nourished. No distress.  HENT:  Head: Normocephalic and atraumatic.  Right Ear: External ear normal.  Left Ear: External ear normal.  Eyes: Conjunctivae normal are normal. Right eye exhibits no discharge. Left eye exhibits no discharge. No scleral icterus.  Neck: Neck supple. No tracheal deviation present.  Cardiovascular: Normal rate.   Pulmonary/Chest: Effort normal. No stridor. No respiratory distress.  Musculoskeletal: She exhibits no edema.  Neurological: She is alert. Cranial nerve deficit: no gross  deficits.  Skin: Skin is warm and dry. Rash noted.       Few small papules on fingers in between web spaces, few small papules on forearms  Psychiatric: She has a normal mood and affect.    ED Course  Procedures (including critical care time)  Labs Reviewed - No data to display No results found.    MDM  ?scabies.  Will try treatment with elimite.  Recc follow up with pcp or dermatologist.  At this time there does not appear to be any evidence of an acute emergency medical condition and the patient appears stable for discharge with appropriate outpatient follow up.         Celene Kras, MD 05/10/12 [phone]

## 2012-05-10 NOTE — ED Notes (Signed)
Pt. Has noted red rash on her arm and hand with itching per Pt.

## 2012-12-07 ENCOUNTER — Emergency Department (HOSPITAL_BASED_OUTPATIENT_CLINIC_OR_DEPARTMENT_OTHER): Payer: Self-pay

## 2012-12-07 ENCOUNTER — Emergency Department (HOSPITAL_BASED_OUTPATIENT_CLINIC_OR_DEPARTMENT_OTHER)
Admission: EM | Admit: 2012-12-07 | Discharge: 2012-12-08 | Disposition: A | Discharge status: Home / Self Care | Payer: Self-pay | Attending: Emergency Medicine | Admitting: Emergency Medicine

## 2012-12-07 ENCOUNTER — Encounter (HOSPITAL_BASED_OUTPATIENT_CLINIC_OR_DEPARTMENT_OTHER): Payer: Self-pay | Admitting: *Deleted

## 2012-12-07 DIAGNOSIS — J45909 Unspecified asthma, uncomplicated: Secondary | ICD-10-CM | POA: Insufficient documentation

## 2012-12-07 DIAGNOSIS — M6740 Ganglion, unspecified site: Secondary | ICD-10-CM | POA: Insufficient documentation

## 2012-12-07 NOTE — ED Notes (Signed)
Pt c/o right wrist pain w/o injury also request " vaginal checkup"

## 2012-12-07 NOTE — ED Notes (Signed)
MD at bedside. 

## 2012-12-08 MED ORDER — ACETAMINOPHEN 500 MG PO TABS: 1000.0000 mg | ORAL_TABLET | Freq: Once | ORAL | Status: DC

## 2012-12-08 MED ORDER — TRAMADOL HCL 50 MG PO TABS: 50.0000 mg | ORAL_TABLET | Freq: Four times a day (QID) | ORAL | Status: DC | PRN

## 2012-12-08 MED ORDER — SODIUM CHLORIDE 0.9 % IV BOLUS (SEPSIS): 1000.0000 mL | Freq: Once | INTRAVENOUS | Status: DC

## 2012-12-08 NOTE — ED Provider Notes (Signed)
History     CSN: [id number]  Arrival date & time 12/07/12  2243   First MD Initiated Contact with Patient 12/07/12 2336      Chief Complaint  Patient presents with  . Wrist Pain    (Consider location/radiation/quality/duration/timing/severity/associated sxs/prior treatment) Patient is a 22 y.o. female presenting with wrist pain. The history is provided by the patient. No language interpreter was used.  Wrist Pain This is a new problem. The current episode started more than 2 days ago. The problem occurs constantly. The problem has not changed since onset.Pertinent negatives include no chest pain, no abdominal pain, no headaches and no shortness of breath. Nothing aggravates the symptoms. Nothing relieves the symptoms. She has tried nothing for the symptoms. The treatment provided no relief.    Past Medical History  Diagnosis Date  . Asthma     Past Surgical History  Procedure Laterality Date  . Tonsillectomy      History reviewed. No pertinent family history.  History  Substance Use Topics  . Smoking status: Never Smoker   . Smokeless tobacco: Not on file  . Alcohol Use: Yes    OB History   Grav Para Term Preterm Abortions TAB SAB Ect Mult Living                  Review of Systems  Respiratory: Negative for shortness of breath.   Cardiovascular: Negative for chest pain.  Gastrointestinal: Negative for abdominal pain.  Neurological: Negative for headaches.  All other systems reviewed and are negative.    Allergies  Penicillins and Doxycycline  Home Medications   Current Outpatient Rx  Name  Route  Sig  Dispense  Refill  . diphenhydrAMINE (BENADRYL) 25 MG tablet   Oral   Take 1 tablet (25 mg total) by mouth every 6 (six) hours.   20 tablet   0   . permethrin (ELIMITE) 5 % cream      Apply to affected area once in the evening and wash off in the am, may repeat in one week   60 g   0     BP 144/81  Pulse 71  Temp(Src) 98.3 F (36.8 C) (Oral)   Resp 16  Ht 5\' 2"  (1.575 m)  Wt 100 lb (45.36 kg)  BMI 18.29 kg/m2  SpO2 99%  LMP 11/30/2012  Physical Exam  Constitutional: She is oriented to person, place, and time. She appears well-developed and well-nourished.  HENT:  Head: Normocephalic and atraumatic.  Mouth/Throat: Oropharynx is clear and moist.  Eyes: Conjunctivae are normal. Pupils are equal, round, and reactive to light.  Neck: Normal range of motion. Neck supple.  Cardiovascular: Normal rate, regular rhythm and intact distal pulses.   Pulmonary/Chest: Effort normal and breath sounds normal. She has no wheezes. She has no rales.  Abdominal: Soft. Bowel sounds are normal.  Musculoskeletal: Normal range of motion. She exhibits no edema and no tenderness.  Right hand neurovascularly intact FROM cap refill < 2 sec no snuff box tenderness  Neurological: She is alert and oriented to person, place, and time. She has normal reflexes.  Skin: Skin is warm and dry.  Psychiatric: She has a normal mood and affect.    ED Course  Procedures (including critical care time)  Labs Reviewed - No data to display No results found.   No diagnosis found.    MDM  Ganglion cyst will prescribe pain meds and refer to hand surgery for ongoing care.  Wants a doctor for  testing and annual exam and pap smear will refer for annual GYN care        Shaquoia Miers K Sung [place], MD 12/08/12 1610

## 2013-03-14 ENCOUNTER — Encounter (HOSPITAL_BASED_OUTPATIENT_CLINIC_OR_DEPARTMENT_OTHER): Payer: Self-pay | Admitting: *Deleted

## 2013-03-14 ENCOUNTER — Emergency Department (HOSPITAL_BASED_OUTPATIENT_CLINIC_OR_DEPARTMENT_OTHER)
Admission: EM | Admit: 2013-03-14 | Discharge: 2013-03-14 | Disposition: A | Discharge status: Home / Self Care | Payer: Self-pay | Attending: Emergency Medicine | Admitting: Emergency Medicine

## 2013-03-14 ENCOUNTER — Emergency Department (HOSPITAL_BASED_OUTPATIENT_CLINIC_OR_DEPARTMENT_OTHER): Payer: Self-pay

## 2013-03-14 DIAGNOSIS — J4521 Mild intermittent asthma with (acute) exacerbation: Secondary | ICD-10-CM

## 2013-03-14 DIAGNOSIS — Z79899 Other long term (current) drug therapy: Secondary | ICD-10-CM | POA: Insufficient documentation

## 2013-03-14 DIAGNOSIS — Z880 Allergy status to penicillin: Secondary | ICD-10-CM | POA: Insufficient documentation

## 2013-03-14 DIAGNOSIS — R059 Cough, unspecified: Secondary | ICD-10-CM | POA: Insufficient documentation

## 2013-03-14 DIAGNOSIS — R05 Cough: Secondary | ICD-10-CM | POA: Insufficient documentation

## 2013-03-14 DIAGNOSIS — R0789 Other chest pain: Secondary | ICD-10-CM | POA: Insufficient documentation

## 2013-03-14 DIAGNOSIS — J45901 Unspecified asthma with (acute) exacerbation: Secondary | ICD-10-CM | POA: Insufficient documentation

## 2013-03-14 MED ORDER — ALBUTEROL SULFATE (5 MG/ML) 0.5% IN NEBU
5.0000 mg | INHALATION_SOLUTION | Freq: Once | RESPIRATORY_TRACT | Status: AC
  Administered 2013-03-14: 5 mg via RESPIRATORY_TRACT
  Filled 2013-03-14: qty 1

## 2013-03-14 MED ORDER — DEXAMETHASONE SODIUM PHOSPHATE 10 MG/ML IJ SOLN
10.0000 mg | Freq: Once | INTRAMUSCULAR | Status: AC
  Administered 2013-03-14: 10 mg via INTRAMUSCULAR
  Filled 2013-03-14: qty 1

## 2013-03-14 NOTE — ED Provider Notes (Signed)
History    CSN: [id number] Arrival date & time 03/14/13  2118  First MD Initiated Contact with Patient 03/14/13 2135     Chief Complaint  Patient presents with  . Shortness of Breath   (Consider location/radiation/quality/duration/timing/severity/associated sxs/prior Treatment) HPI  Patient is a 22 yo F PMHx significant for asthma presented to the ED for worsening asthma exacerbation with non-productive cough and chest tightness since Friday despite using her at home rescue inhaler every 4-5 hours at home that is only providing minimal relief. Patient recently moved to a home with moisture and possible mold, which she believes is exacerbating her symptoms. Denies fevers, chills, nausea, vomiting, headache. PERC negative.   Past Medical History  Diagnosis Date  . Asthma    Past Surgical History  Procedure Laterality Date  . Tonsillectomy     No family history on file. History  Substance Use Topics  . Smoking status: Never Smoker   . Smokeless tobacco: Not on file  . Alcohol Use: Yes   OB History   Grav Para Term Preterm Abortions TAB SAB Ect Mult Living                 Review of Systems  Constitutional: Negative for fever, chills and fatigue.  HENT: Negative for neck pain and neck stiffness.   Respiratory: Positive for cough, chest tightness and shortness of breath.   Cardiovascular: Negative for chest pain, palpitations and leg swelling.  Neurological: Negative for headaches.  All other systems reviewed and are negative.    Allergies  Penicillins and Doxycycline  Home Medications   Current Outpatient Rx  Name  Route  Sig  Dispense  Refill  . ALBUTEROL IN   Inhalation   Inhale into the lungs.         . diphenhydrAMINE (BENADRYL) 25 MG tablet   Oral   Take 1 tablet (25 mg total) by mouth every 6 (six) hours.   20 tablet   0   . permethrin (ELIMITE) 5 % cream      Apply to affected area once in the evening and wash off in the am, may repeat in one  week   60 g   0   . traMADol (ULTRAM) 50 MG tablet   Oral   Take 1 tablet (50 mg total) by mouth every 6 (six) hours as needed for pain.   15 tablet   0    BP 137/91  Pulse 71  Temp(Src) 98.7 F (37.1 C) (Oral)  Resp 18  Wt 100 lb (45.36 kg)  BMI 18.29 kg/m2  SpO2 100% Physical Exam  Constitutional: She is oriented to person, place, and time. She appears well-developed and well-nourished. No distress.  HENT:  Head: Normocephalic and atraumatic.  Mouth/Throat: Oropharynx is clear and moist.  Eyes: Conjunctivae are normal.  Neck: Neck supple.  Cardiovascular: Normal rate, regular rhythm, normal heart sounds and intact distal pulses.   Pulmonary/Chest: Effort normal and breath sounds normal. No respiratory distress. She has no wheezes. She exhibits no tenderness.  Musculoskeletal: She exhibits no edema.  Lymphadenopathy:    She has no cervical adenopathy.  Neurological: She is alert and oriented to person, place, and time.  Skin: Skin is warm and dry. She is not diaphoretic.  Psychiatric: She has a normal mood and affect.    ED Course  Procedures (including critical care time)  Medications  albuterol (PROVENTIL) (5 MG/ML) 0.5% nebulizer solution 5 mg (5 mg Nebulization Given 03/14/13 2137)  dexamethasone (DECADRON)  injection 10 mg (10 mg Intramuscular Given 03/14/13 2156)    Labs Reviewed - No data to display Dg Chest 2 View  03/14/2013   *RADIOLOGY REPORT*  Clinical Data: Shortness of breath  CHEST - 2 VIEW  Comparison: None.  Findings: Cardiomediastinal silhouette appears normal.  No acute pulmonary disease is noted.  Bony thorax is intact.  IMPRESSION: No acute cardiopulmonary abnormality seen.   Original Report Authenticated By: Lupita Raider.,  M.D.   1. Asthma, mild intermittent, with acute exacerbation     MDM  Patient ambulated in ED with O2 saturations maintained >90, no current signs of respiratory distress. Lung exam improved after nebulizer treatment.  Imaging reviewed. Decadron given in the ED. Pt states they are breathing at baseline. Pt has been instructed to continue using prescribed medications and to speak with PCP about today's exacerbation. Patient is agreeable to plan. Patient is stable at time of discharge     Jeannetta Ellis, PA-C 03/14/13 2228  Lise Auer Marston Mccadden, PA-C 03/14/13 2229

## 2013-03-14 NOTE — ED Notes (Signed)
States she thinks moisture and mold in her apartment are causing her to have sob. Able to speak in complete sentences. Does not appear to be in resp distress or sob. Ambulatory to triage with no difficulty.

## 2013-03-15 NOTE — ED Provider Notes (Signed)
Medical screening examination/treatment/procedure(s) were performed by non-physician practitioner and as supervising physician I was immediately available for consultation/collaboration.   Carleene Cooper III, MD 03/15/13 [phone]

## 2013-03-22 ENCOUNTER — Ambulatory Visit: Payer: Medicaid Other | Attending: Family Medicine | Admitting: Family Medicine

## 2013-03-22 VITALS — BP 147/96 | HR 49 | Temp 98.5°F | Resp 18 | Wt 100.8 lb

## 2013-03-22 DIAGNOSIS — J45901 Unspecified asthma with (acute) exacerbation: Secondary | ICD-10-CM | POA: Insufficient documentation

## 2013-03-22 DIAGNOSIS — J4541 Moderate persistent asthma with (acute) exacerbation: Secondary | ICD-10-CM

## 2013-03-22 DIAGNOSIS — J309 Allergic rhinitis, unspecified: Secondary | ICD-10-CM

## 2013-03-22 DIAGNOSIS — J45909 Unspecified asthma, uncomplicated: Secondary | ICD-10-CM | POA: Insufficient documentation

## 2013-03-22 DIAGNOSIS — J4521 Mild intermittent asthma with (acute) exacerbation: Secondary | ICD-10-CM

## 2013-03-22 MED ORDER — MONTELUKAST SODIUM 10 MG PO TABS: 10.0000 mg | ORAL_TABLET | Freq: Every day | ORAL | Status: DC

## 2013-03-22 MED ORDER — ALBUTEROL SULFATE HFA 108 (90 BASE) MCG/ACT IN AERS: 2.0000 | INHALATION_SPRAY | Freq: Four times a day (QID) | RESPIRATORY_TRACT | Status: DC | PRN

## 2013-03-22 MED ORDER — FLUTICASONE PROPIONATE HFA 110 MCG/ACT IN AERO: 1.0000 | INHALATION_SPRAY | Freq: Two times a day (BID) | RESPIRATORY_TRACT | Status: DC

## 2013-03-22 MED ORDER — LORATADINE 10 MG PO TABS: 10.0000 mg | ORAL_TABLET | Freq: Every day | ORAL | Status: DC

## 2013-03-22 MED ORDER — PREDNISONE 10 MG PO TABS: 20.0000 mg | ORAL_TABLET | Freq: Every day | ORAL | Status: DC

## 2013-03-22 NOTE — Progress Notes (Signed)
Patient has history of asthma Recently seen in the ED Only uses and inhaler

## 2013-03-22 NOTE — Progress Notes (Signed)
Patient ID: Katherine Dominguez, female   DOB: [birth year], 22 y.o.   MRN: [id number]  CC:  Establish Care Asthma   HPI: Pt reports lives in an apartment with mold and having worsening asthma symptoms. Pt was treated in ER for asthma exacerbation with decadron but reports that symptoms of wheezing and SOB are starting to return.  She is using her rescue inhaler multiple times throughout the day.  She has been doing this for quite some time.  She has never been on an inhaled steroid.   Allergies  Allergen Reactions  . Penicillins Anaphylaxis and Other (See Comments)    unknown  . Doxycycline Hives and Rash   Past Medical History  Diagnosis Date  . Asthma    Current Outpatient Prescriptions on File Prior to Visit  Medication Sig Dispense Refill  . ALBUTEROL IN Inhale into the lungs.      . diphenhydrAMINE (BENADRYL) 25 MG tablet Take 1 tablet (25 mg total) by mouth every 6 (six) hours.  20 tablet  0  . permethrin (ELIMITE) 5 % cream Apply to affected area once in the evening and wash off in the am, may repeat in one week  60 g  0  . traMADol (ULTRAM) 50 MG tablet Take 1 tablet (50 mg total) by mouth every 6 (six) hours as needed for pain.  15 tablet  0   No current facility-administered medications on file prior to visit.   History reviewed. No pertinent family history. History   Social History  . Marital Status: Single    Spouse Name: N/A    Number of Children: N/A  . Years of Education: N/A   Occupational History  . Not on file.   Social History Main Topics  . Smoking status: Never Smoker   . Smokeless tobacco: Not on file  . Alcohol Use: Yes  . Drug Use: No  . Sexually Active: Yes    Birth Control/ Protection: None   Other Topics Concern  . Not on file   Social History Narrative  . No narrative on file    Review of Systems  Constitutional: Negative for fever, chills, diaphoresis, activity change, appetite change and fatigue.  HENT: Negative for ear pain, nosebleeds,  positve for congestion, facial swelling, rhinorrhea, neck pain, neck stiffness and ear discharge.   Eyes: Negative for pain, discharge, redness, itching and visual disturbance.  Respiratory: see HPI.   Cardiovascular: Negative for chest pain, palpitations and leg swelling.  Gastrointestinal: Negative for abdominal distention.  Genitourinary: Negative for dysuria, urgency, frequency, hematuria, flank pain, decreased urine volume, difficulty urinating and dyspareunia.  Musculoskeletal: Negative for back pain, joint swelling, arthralgias and gait problem.  Neurological: Negative for dizziness, tremors, seizures, syncope, facial asymmetry, speech difficulty, weakness, light-headedness, numbness and headaches.  Hematological: Negative for adenopathy. Does not bruise/bleed easily.  Psychiatric/Behavioral: Negative for hallucinations, behavioral problems, confusion, dysphoric mood, decreased concentration and agitation.    Objective:   Filed Vitals:   03/22/13 1201  BP: 147/96  Pulse: 49  Temp: 98.5 F (36.9 C)  Resp: 18    Physical Exam  Constitutional: Appears well-developed and well-nourished. No distress.  HENT: Normocephalic. External right and left ear normal. Oropharynx is clear and moist. bilateral swollen red nasal turbinates.  Eyes: Conjunctivae and EOM are normal. PERRLA, no scleral icterus.  Neck: Normal ROM. Neck supple. No JVD. No tracheal deviation. No thyromegaly.  CVS: RRR, S1/S2 +, no murmurs, no gallops, no carotid bruit.  Pulmonary: Effort and breath sounds  normal, no stridor, rhonchi, wheezes, rales.  Abdominal: Soft. BS +,  no distension, tenderness, rebound or guarding.  Musculoskeletal: Normal range of motion. No edema and no tenderness.  Lymphadenopathy: No lymphadenopathy noted, cervical, inguinal. Neuro: Alert. Normal reflexes, muscle tone coordination. No cranial nerve deficit. Skin: Skin is warm and dry. No rash noted. Not diaphoretic. No erythema. No pallor.   Psychiatric: Normal mood and affect. Behavior, judgment, thought content normal.   Lab Results  Component Value Date   WBC 6.3 01/16/2011   HGB 13.3 01/16/2011   HCT 38.3 01/16/2011   MCV 79.8 01/16/2011   PLT 280 01/16/2011   Lab Results  Component Value Date   CREATININE <0.47 01/16/2011   BUN 17 01/16/2011   NA 139 01/16/2011   K 3.3* 01/16/2011   CL 102 01/16/2011   CO2 26 01/16/2011    No results found for this basename: HGBA1C   Lipid Panel  No results found for this basename: chol, trig, hdl, cholhdl, vldl, ldlcalc       Assessment and plan:   Patient Active Problem List   Diagnosis Date Noted  . Asthma with acute exacerbation 03/22/2013  . 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 Allergic rhinitis 03/22/2013   Uncontrolled Asthma, moderate persistent as evidenced by frequent use of rescue inhalers:   Pt was given Rx for prednisone taper to take for a few more days   Singulair 10 mg po daily prescribed  Flovent inhaler prescribed  Pt to get [facility] card  Flonase nasal spray recommended  RTC in 3 weeks for check up  The patient was given clear instructions to go to ER or return to medical center if symptoms don't improve, worsen or new problems develop.  The patient verbalized understanding.  The patient was told to call to get any lab results if not heard anything in the next week.    Rodney Langton, MD, CDE, FAAFP [facility] [place], [place]

## 2013-03-22 NOTE — Patient Instructions (Addendum)
Asthma, Adult Asthma is a disease of the lungs and can make it hard to breathe. Asthma cannot be cured, but medicine can help control it. Asthma may be started (triggered) by:  Pollen.  Dust.  Animal skin flakes (dander).  Molds.  Foods.  Respiratory infections (colds, flu).  Smoke.  Exercise.  Stress.  Other things that cause allergic reactions or allergies (allergens). HOME CARE   Talk to your doctor about how to manage your attacks at home. This may include:  Using a tool called a peak flow meter.  Having medicine ready to stop the attack.  Take all medicine as told by your doctor.  Wash bed sheets and blankets every week in hot water and put them in the dryer.  Drink enough fluids to keep your pee (urine) clear or pale yellow.  Always be ready to get emergency help. Write down the phone number for your doctor. Keep it where you can easily find it.  Talk about exercise routines with your doctor.  If animal dander is causing your asthma, you may need to find a new home for your pet(s). GET HELP RIGHT AWAY IF:   You have muscle aches.  You cough more.  You have chest pain.  You have thick spit (sputum) that changes to yellow, green, gray, or bloody.  Medicine does not stop your wheezing.  You have problems breathing.  You have a fever.  Your medicine causes:  A rash.  Itching.  Puffiness (swelling).  Breathing problems. MAKE SURE YOU:   Understand these instructions.  Will watch your condition.  Will get help right away if you are not doing well or get worse. Document Released: 01/27/2008 Document Revised: 11/02/2011 Document Reviewed: 06/20/2008 [facility] Patient Information 2014 [place], [place]. Allergic Rhinitis Allergic rhinitis is when the mucous membranes in the nose respond to allergens. Allergens are particles in the air that cause your body to have an allergic reaction. This causes you to release allergic antibodies. Through a chain  of events, these eventually cause you to release histamine into the blood stream (hence the use of antihistamines). Although meant to be protective to the body, it is this release that causes your discomfort, such as frequent sneezing, congestion and an itchy runny nose.  CAUSES  The pollen allergens may come from grasses, trees, and weeds. This is seasonal allergic rhinitis, or "hay fever." Other allergens cause year-round allergic rhinitis (perennial allergic rhinitis) such as house dust mite allergen, pet dander and mold spores.  SYMPTOMS   Nasal stuffiness (congestion).  Runny, itchy nose with sneezing and tearing of the eyes.  There is often an itching of the mouth, eyes and ears. It cannot be cured, but it can be controlled with medications. DIAGNOSIS  If you are unable to determine the offending allergen, skin or blood testing may find it. TREATMENT   Avoid the allergen.  Medications and allergy shots (immunotherapy) can help.  Hay fever may often be treated with antihistamines in pill or nasal spray forms. Antihistamines block the effects of histamine. There are over-the-counter medicines that may help with nasal congestion and swelling around the eyes. Check with your caregiver before taking or giving this medicine. If the treatment above does not work, there are many new medications your caregiver can prescribe. Stronger medications may be used if initial measures are ineffective. Desensitizing injections can be used if medications and avoidance fails. Desensitization is when a patient is given ongoing shots until the body becomes less sensitive to the allergen.  Make sure you follow up with your caregiver if problems continue. SEEK MEDICAL CARE IF:   You develop fever (more than 100.5 F (38.1 C).  You develop a cough that does not stop easily (persistent).  You have shortness of breath.  You start wheezing.  Symptoms interfere with normal daily activities. Document  Released: 05/05/2001 Document Revised: 11/02/2011 Document Reviewed: 11/14/2008 [facility] Patient Information 2014 [place], [place]. Asthma Attack Prevention HOW CAN ASTHMA BE PREVENTED? Currently, there is no way to prevent asthma from starting. However, you can take steps to control the disease and prevent its symptoms after you have been diagnosed. Learn about your asthma and how to control it. Take an active role to control your asthma by working with your caregiver to create and follow an asthma action plan. An asthma action plan guides you in taking your medicines properly, avoiding factors that make your asthma worse, tracking your level of asthma control, responding to worsening asthma, and seeking emergency care when needed. To track your asthma, keep records of your symptoms, check your peak flow number using a peak flow meter (handheld device that shows how well air moves out of your lungs), and get regular asthma checkups.  Other ways to prevent asthma attacks include:  Use medicines as your caregiver directs.  Identify and avoid things that make your asthma worse (as much as you can).  Keep track of your asthma symptoms and level of control.  Get regular checkups for your asthma.  With your caregiver, write a detailed plan for taking medicines and managing an asthma attack. Then be sure to follow your action plan. Asthma is an ongoing condition that needs regular monitoring and treatment.  Identify and avoid asthma triggers. A number of outdoor allergens and irritants (pollen, mold, cold air, air pollution) can trigger asthma attacks. Find out what causes or makes your asthma worse, and take steps to avoid those triggers.  Monitor your breathing. Learn to recognize warning signs of an attack, such as slight coughing, wheezing or shortness of breath. However, your lung function may already decrease before you notice any signs or symptoms, so regularly measure and record your peak airflow  with a home peak flow meter.  Identify and treat attacks early. If you act quickly, you're less likely to have a severe attack. You will also need less medicine to control your symptoms. When your peak flow measurements decrease and alert you to an upcoming attack, take your medicine as instructed, and immediately stop any activity that may have triggered the attack. If your symptoms do not improve, get medical help.  Pay attention to increasing quick-relief inhaler use. If you find yourself relying on your quick-relief inhaler (such as albuterol), your asthma is not under control. See your caregiver about adjusting your treatment. IDENTIFY AND CONTROL FACTORS THAT MAKE YOUR ASTHMA WORSE A number of common things can set off or make your asthma symptoms worse (asthma triggers). Keep track of your asthma symptoms for several weeks, detailing all the environmental and emotional factors that are linked with your asthma. When you have an asthma attack, go back to your asthma diary to see which factor, or combination of factors, might have contributed to it. Once you know what these factors are, you can take steps to control many of them.  Allergies: If you have allergies and asthma, it is important to take asthma prevention steps at home. Asthma attacks (worsening of asthma symptoms) can be triggered by allergies, which can cause temporary increased inflammation  of your airways. Minimizing contact with the substance to which you are allergic will help prevent an asthma attack. Animal Dander:   Some people are allergic to the flakes of skin or dried saliva from animals with fur or feathers. Keep these pets out of your home.  If you can't keep a pet outdoors, keep the pet out of your bedroom and other sleeping areas at all times, and keep the door closed.  Remove carpets and furniture covered with cloth from your home. If that is not possible, keep the pet away from fabric-covered furniture and  carpets. Dust Mites:  Many people with asthma are allergic to dust mites. Dust mites are tiny bugs that are found in every home, in mattresses, pillows, carpets, fabric-covered furniture, bedcovers, clothes, stuffed toys, fabric, and other fabric-covered items.  Cover your mattress in a special dust-proof cover.  Cover your pillow in a special dust-proof cover, or wash the pillow each week in hot water. Water must be hotter than 130 F to kill dust mites. Cold or warm water used with detergent and bleach can also be effective.  Wash the sheets and blankets on your bed each week in hot water.  Try not to sleep or lie on cloth-covered cushions.  Call ahead when traveling and ask for a smoke-free hotel room. Bring your own bedding and pillows, in case the hotel only supplies feather pillows and down comforters, which may contain dust mites and cause asthma symptoms.  Remove carpets from your bedroom and those laid on concrete, if you can.  Keep stuffed toys out of the bed, or wash the toys weekly in hot water or cooler water with detergent and bleach. Cockroaches:  Many people with asthma are allergic to the droppings and remains of cockroaches.  Keep food and garbage in closed containers. Never leave food out.  Use poison baits, traps, powders, gels, or paste (for example, boric acid).  If a spray is used to kill cockroaches, stay out of the room until the odor goes away. Indoor Mold:  Fix leaky faucets, pipes, or other sources of water that have mold around them.  Clean floors and moldy surfaces with a fungicide or diluted bleach.  Avoid using humidifiers, vaporizers, or swamp coolers. These can spread molds through the air. Pollen and Outdoor Mold:  When pollen or mold spore counts are high, try to keep your windows closed.  Stay indoors with windows closed from late morning to afternoon, if you can. Pollen and some mold spore counts are highest at that time.  Ask your  caregiver whether you need to take or increase anti-inflammatory medicine before your allergy season starts. Irritants:   Tobacco smoke is an irritant. If you smoke, ask your caregiver how you can quit. Ask family members to quit smoking, too. Do not allow smoking in your home or car.  If possible, do not use a wood-burning stove, kerosene heater, or fireplace. Minimize exposure to all sources of smoke, including incense, candles, fires, and fireworks.  Try to stay away from strong odors and sprays, such as perfume, talcum powder, hair spray, and paints.  Decrease humidity in your home and use an indoor air cleaning device. Reduce indoor humidity to below 60 percent. Dehumidifiers or central air conditioners can do this.  Decrease house dust exposure by changing furnace and air cooler filters frequently.  Try to have someone else vacuum for you once or twice a week, if you can. Stay out of rooms while they are being  vacuumed and for a short while afterward.  If you vacuum, use a dust mask from a hardware store, a double-layered or microfilter vacuum cleaner bag, or a vacuum cleaner with a HEPA filter.  Sulfites in foods and beverages can be irritants. Do not drink beer or wine, or eat dried fruit, processed potatoes, or shrimp if they cause asthma symptoms.  Cold air can trigger an asthma attack. Cover your nose and mouth with a scarf on cold or windy days.  Several health conditions can make asthma more difficult to manage, including runny nose, sinus infections, reflux disease, psychological stress, and sleep apnea. Your caregiver will treat these conditions, as well.  Avoid close contact with people who have a cold or the flu, since your asthma symptoms may get worse if you catch the infection from them. Wash your hands thoroughly after touching items that may have been handled by people with a respiratory infection.  Get a flu shot every year to protect against the flu virus, which often  makes asthma worse for days or weeks. Also get a pneumonia shot once every 5 10 years. Medicines:  Aspirin and other pain relievers can cause asthma attacks. Ten percent to 20% of people with asthma have sensitivity to aspirin or a group of pain relievers called non-steroidal anti-inflammatory medicines (NSAIDS), such as ibuprofen and naproxen. These medicines are used to treat pain and reduce fevers. Asthma attacks caused by any of these medicines can be severe and even fatal. These medicines must be avoided in people who have known aspirin sensitive asthma. Products with acetaminophen are considered safe for people who have asthma. It is important that people with aspirin sensitivity read labels of all over-the-counter medicines used to treat pain, colds, coughs, and fever.  Beta blockers and ACE inhibitors are other medicines which you should discuss with your caregiver, in relation to your asthma. ALLERGY SKIN TESTING  Ask your asthma caregiver about allergy skin testing or blood testing (RAST test) to identify the allergens to which you are sensitive. If you are found to have allergies, allergy shots (immunotherapy) for asthma may help prevent future allergies and asthma. With allergy shots, small doses of allergens (substances to which you are allergic) are injected under your skin on a regular schedule. Over a period of time, your body may become used to the allergen and less responsive with asthma symptoms. You can also take measures to minimize your exposure to those allergens. EXERCISE  If you have exercise-induced asthma, or are planning vigorous exercise, or exercise in cold, humid, or dry environments, prevent exercise-induced asthma by following your caregiver's advice regarding asthma treatment before exercising. Document Released: 07/29/2009 Document Revised: 07/27/2012 Document Reviewed: 07/29/2009 [facility] Patient Information 2014 [place], [place].

## 2013-03-23 ENCOUNTER — Encounter: Payer: Self-pay | Admitting: Family Medicine

## 2013-04-12 ENCOUNTER — Ambulatory Visit: Payer: Medicaid Other

## 2013-05-13 IMAGING — US US ABDOMEN COMPLETE
1 series · 14 of 25 positions shown · non-contrast
Comparison: 12/30/2006

CLINICAL DATA: Abdominal pain, nausea, vomiting.

COMPLETE ABDOMINAL ULTRASOUND

[Series 1: us abdomen complete · 0.26mm/px · 14 of 69 slices shown]
[im 1/69]
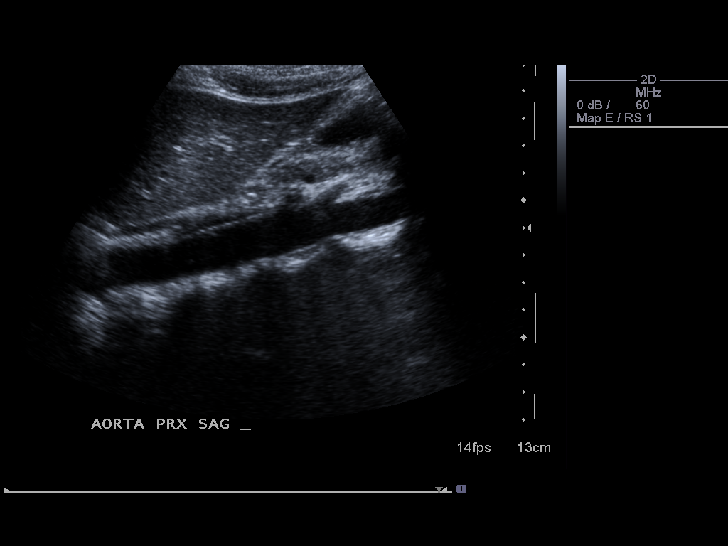
[im 6/69]
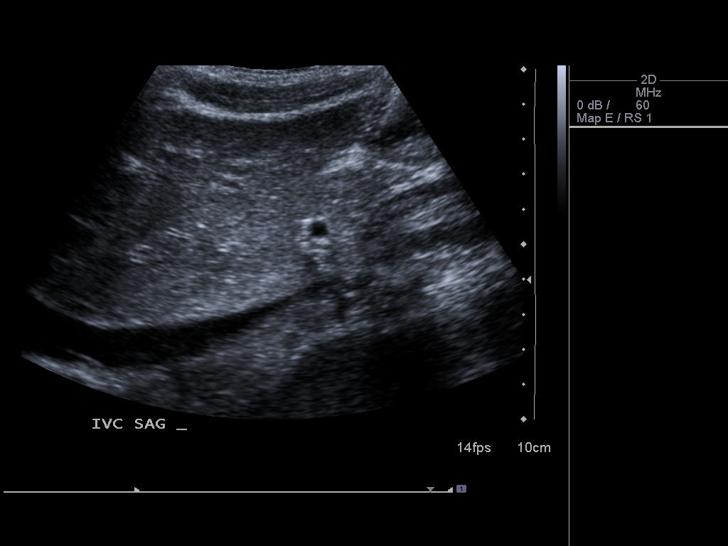
[im 12/69]
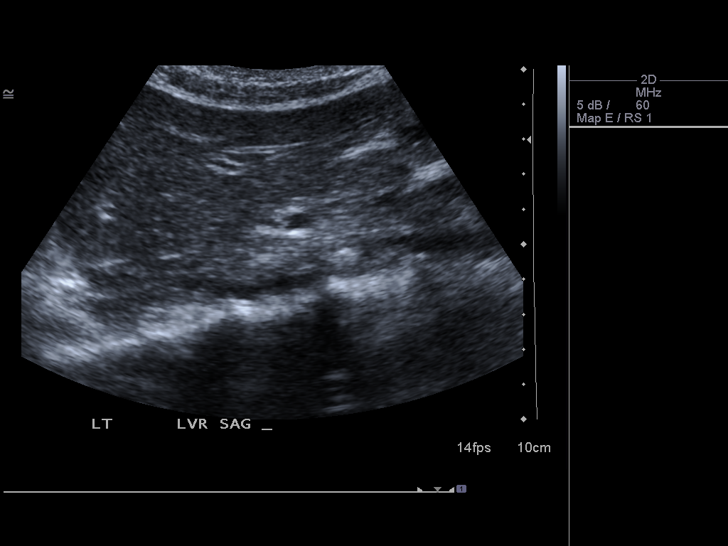
[im 18/69]
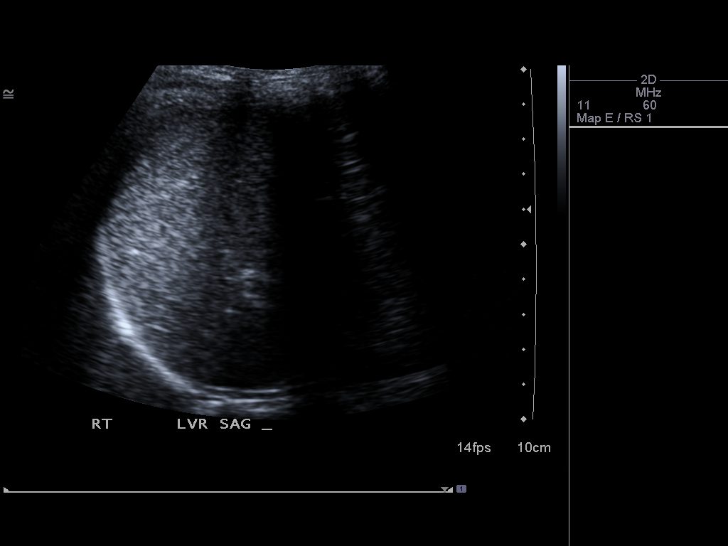
[im 23/69]
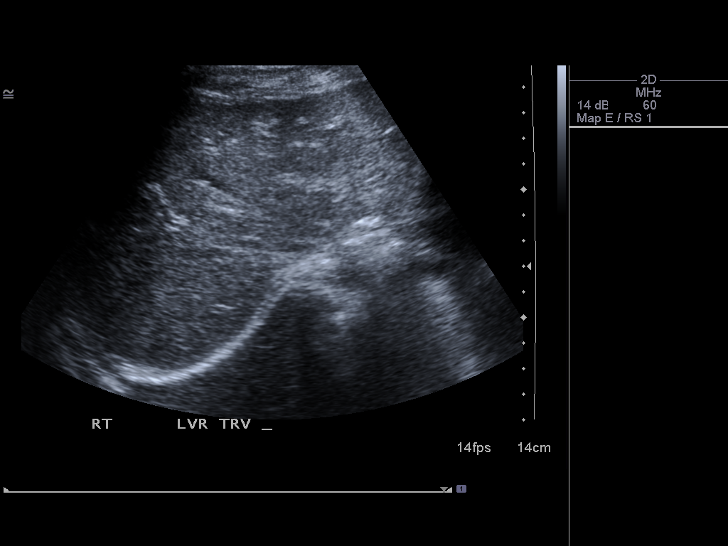
[im 26/69]
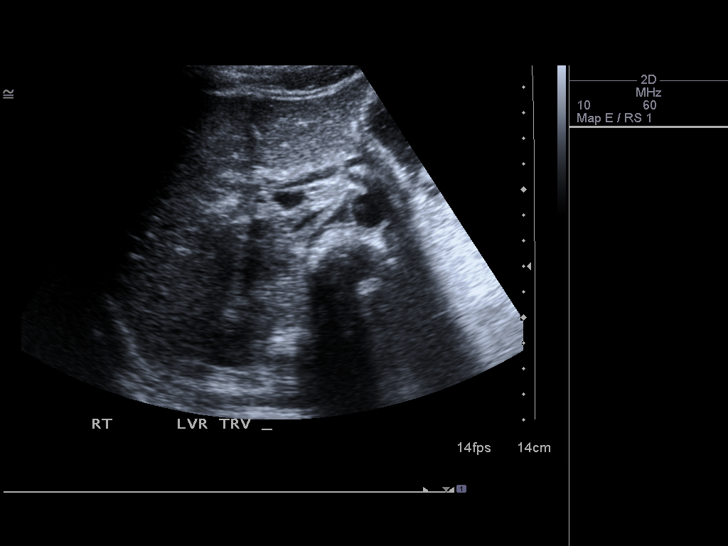
[im 32/69]
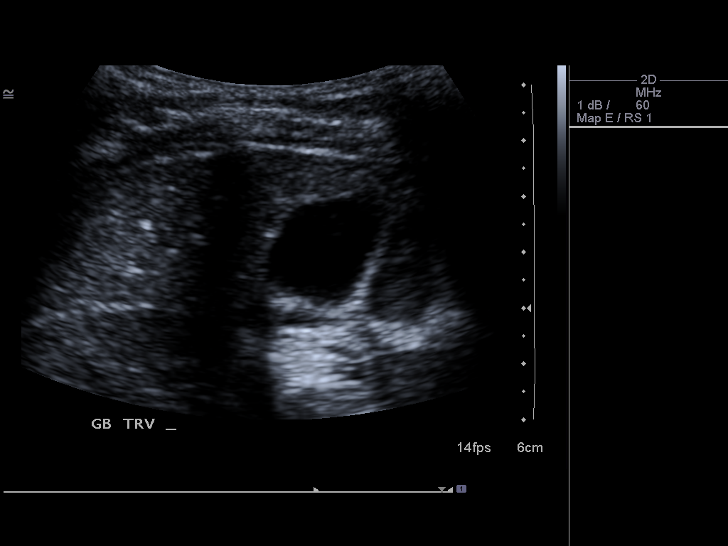
[im 37/69]
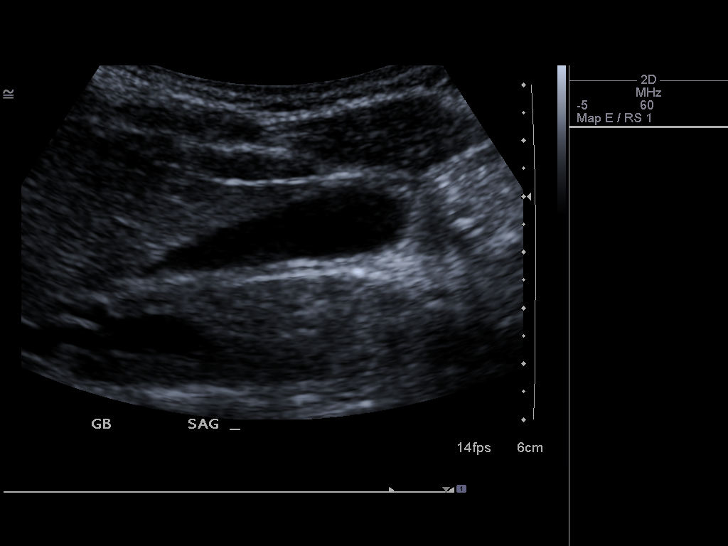
[im 43/69]
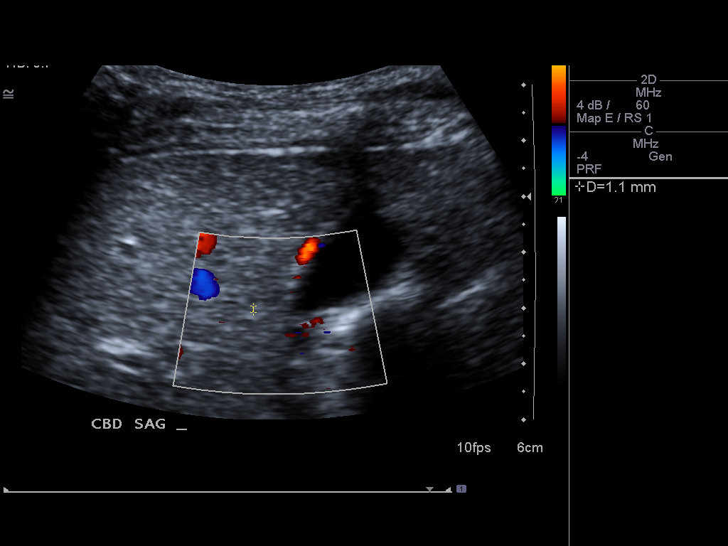
[im 46/69]
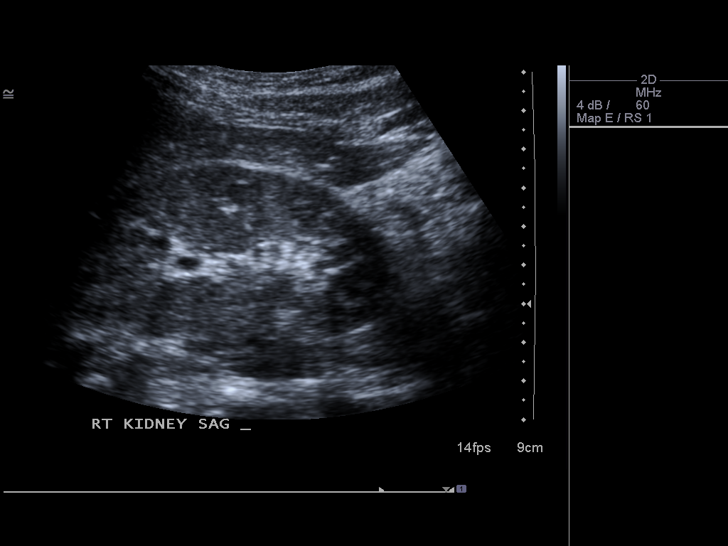
[im 52/69]
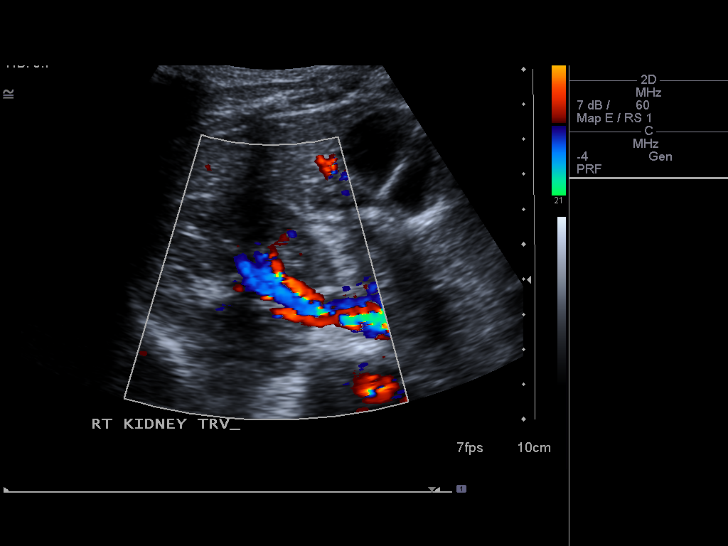
[im 57/69]
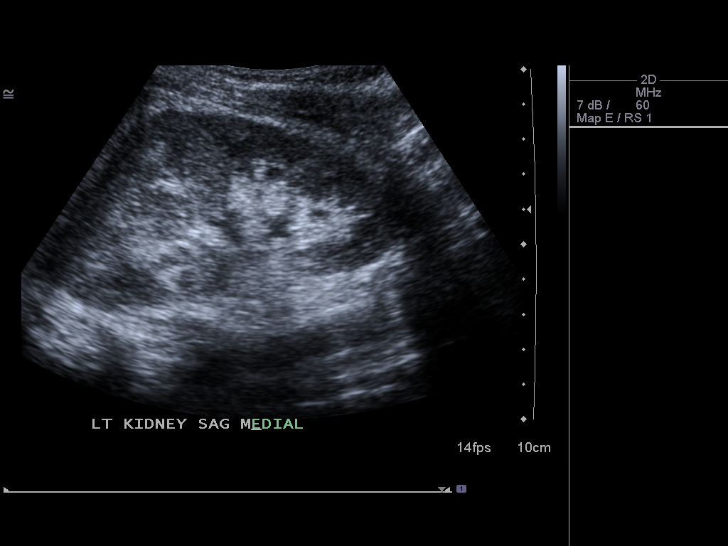
[im 63/69]
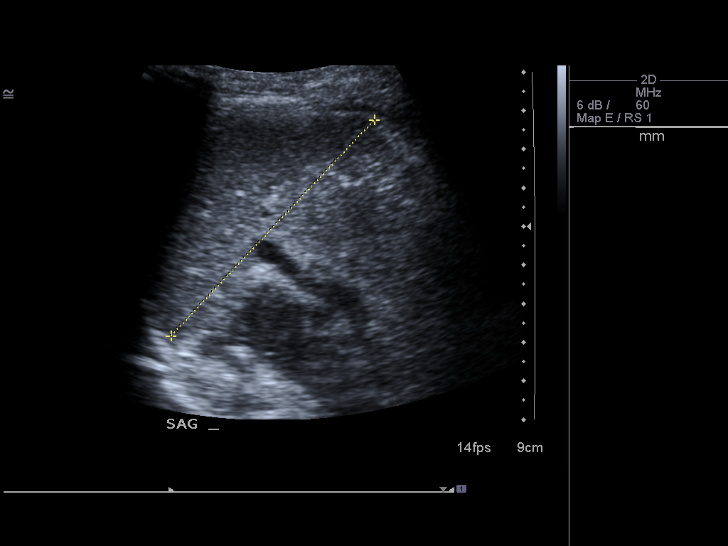
[im 69/69]
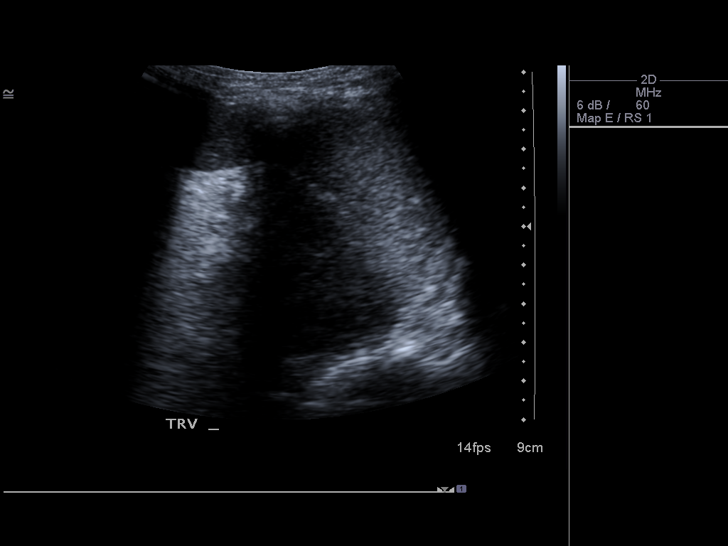

[14 of 25 positions shown; findings below may reference images not displayed]

FINDINGS: Gallbladder:  No gallstones, gallbladder wall thickening, or
pericholecystic fluid.

Common bile duct:   Within normal limits in caliber.

Liver:  No focal lesion identified.  Within normal limits in
parenchymal echogenicity.

IVC:  Not well visualized due to overlying bowel gas.

Pancreas:  Not well visualized due to overlying bowel gas.

Spleen:  Within normal limits in size and echotexture.

Right Kidney:   Normal in size and parenchymal echogenicity.  No
evidence of mass or hydronephrosis.

Left Kidney:  Normal in size and parenchymal echogenicity.  No
evidence of mass or hydronephrosis.

Abdominal aorta:  Limited visualization.  No aneurysm seen.
IMPRESSION: Negative abdominal ultrasound.  Midline structures not well
visualized due to overlying bowel gas.

## 2013-10-26 ENCOUNTER — Emergency Department (HOSPITAL_BASED_OUTPATIENT_CLINIC_OR_DEPARTMENT_OTHER)
Admission: EM | Admit: 2013-10-26 | Discharge: 2013-10-26 | Disposition: A | Discharge status: Home / Self Care | Payer: Medicaid Other | Attending: Emergency Medicine | Admitting: Emergency Medicine

## 2013-10-26 ENCOUNTER — Encounter (HOSPITAL_BASED_OUTPATIENT_CLINIC_OR_DEPARTMENT_OTHER): Payer: Self-pay | Admitting: Emergency Medicine

## 2013-10-26 DIAGNOSIS — J45909 Unspecified asthma, uncomplicated: Secondary | ICD-10-CM | POA: Insufficient documentation

## 2013-10-26 DIAGNOSIS — IMO0002 Reserved for concepts with insufficient information to code with codable children: Secondary | ICD-10-CM | POA: Insufficient documentation

## 2013-10-26 DIAGNOSIS — N898 Other specified noninflammatory disorders of vagina: Secondary | ICD-10-CM | POA: Insufficient documentation

## 2013-10-26 DIAGNOSIS — R109 Unspecified abdominal pain: Secondary | ICD-10-CM | POA: Insufficient documentation

## 2013-10-26 DIAGNOSIS — R350 Frequency of micturition: Secondary | ICD-10-CM | POA: Insufficient documentation

## 2013-10-26 DIAGNOSIS — Z79899 Other long term (current) drug therapy: Secondary | ICD-10-CM | POA: Insufficient documentation

## 2013-10-26 DIAGNOSIS — Z880 Allergy status to penicillin: Secondary | ICD-10-CM | POA: Insufficient documentation

## 2013-10-26 DIAGNOSIS — O9989 Other specified diseases and conditions complicating pregnancy, childbirth and the puerperium: Secondary | ICD-10-CM | POA: Insufficient documentation

## 2013-10-26 DIAGNOSIS — R110 Nausea: Secondary | ICD-10-CM | POA: Insufficient documentation

## 2013-10-26 DIAGNOSIS — R51 Headache: Secondary | ICD-10-CM | POA: Insufficient documentation

## 2013-10-26 DIAGNOSIS — O26899 Other specified pregnancy related conditions, unspecified trimester: Secondary | ICD-10-CM

## 2013-10-26 LAB — CBC WITH DIFFERENTIAL/PLATELET
Basophils Absolute: 0 10*3/uL (ref 0.0–0.1)
Basophils Relative: 1 % (ref 0–1)
Eosinophils Absolute: 0.1 10*3/uL (ref 0.0–0.7)
Eosinophils Relative: 2 % (ref 0–5)
HCT: 36 % (ref 36.0–46.0)
HEMOGLOBIN: 12.4 g/dL (ref 12.0–15.0)
LYMPHS ABS: 2.1 10*3/uL (ref 0.7–4.0)
LYMPHS PCT: 40 % (ref 12–46)
MCH: 28.9 pg (ref 26.0–34.0)
MCHC: 34.4 g/dL (ref 30.0–36.0)
MCV: 83.9 fL (ref 78.0–100.0)
MONOS PCT: 9 % (ref 3–12)
Monocytes Absolute: 0.5 10*3/uL (ref 0.1–1.0)
NEUTROS PCT: 49 % (ref 43–77)
Neutro Abs: 2.6 10*3/uL (ref 1.7–7.7)
PLATELETS: 240 10*3/uL (ref 150–400)
RBC: 4.29 MIL/uL (ref 3.87–5.11)
RDW: 13.2 % (ref 11.5–15.5)
WBC: 5.3 10*3/uL (ref 4.0–10.5)

## 2013-10-26 LAB — URINALYSIS, ROUTINE W REFLEX MICROSCOPIC
BILIRUBIN URINE: NEGATIVE
Glucose, UA: NEGATIVE mg/dL
HGB URINE DIPSTICK: NEGATIVE
Ketones, ur: NEGATIVE mg/dL
Leukocytes, UA: NEGATIVE
NITRITE: NEGATIVE
PROTEIN: NEGATIVE mg/dL
SPECIFIC GRAVITY, URINE: 1.028 (ref 1.005–1.030)
UROBILINOGEN UA: 0.2 mg/dL (ref 0.0–1.0)
pH: 6 (ref 5.0–8.0)

## 2013-10-26 LAB — WET PREP, GENITAL
TRICH WET PREP: NONE SEEN
Yeast Wet Prep HPF POC: NONE SEEN

## 2013-10-26 LAB — HCG, QUANTITATIVE, PREGNANCY: HCG, BETA CHAIN, QUANT, S: 567 m[IU]/mL — AB (ref <5)

## 2013-10-26 MED ORDER — ACETAMINOPHEN 325 MG PO TABS
650.0000 mg | ORAL_TABLET | Freq: Once | ORAL | Status: AC
  Administered 2013-10-26: 650 mg via ORAL
  Filled 2013-10-26: qty 2

## 2013-10-26 NOTE — ED Provider Notes (Signed)
CSN: [id number]     Arrival date & time 10/26/13  2003 History   First MD Initiated Contact with Patient 10/26/13 2019     Chief Complaint  Patient presents with  . Abdominal Pain     (Consider location/radiation/quality/duration/timing/severity/associated sxs/prior Treatment) Patient is a 23 y.o. female presenting with vaginal discharge. The history is provided by the patient.  Vaginal Discharge Duration:  4 days Timing:  Constant Relieved by:  None tried Worsened by:  Nothing tried Ineffective treatments:  None tried Associated symptoms: abdominal pain and nausea   Associated symptoms: no dysuria, no fever and no vomiting    Katherine Dominguez is a 23 y.o. female who presents to the ED with lower abdominal pain that has been off and on x 1 month and vaginal discharge x 4 days. Her period was due one week ago. She reports nausea but no vomiting. She is using nothing for birth control. G1,@ [redacted] weeks gestation by  LMP 09/25/2013. Last pap smear less than one year ago and was normal. Hx of genital herpes.   Past Medical History  Diagnosis Date  . Asthma    Past Surgical History  Procedure Laterality Date  . Tonsillectomy     No family history on file. History  Substance Use Topics  . Smoking status: Never Smoker   . Smokeless tobacco: Not on file  . Alcohol Use: Yes   OB History   Grav Para Term Preterm Abortions TAB SAB Ect Mult Living                 Review of Systems  Constitutional: Negative for fever and chills.  Eyes: Negative for visual disturbance.  Respiratory: Negative for cough and shortness of breath.   Cardiovascular: Negative for chest pain.  Gastrointestinal: Positive for nausea and abdominal pain. Negative for vomiting.  Genitourinary: Positive for frequency and vaginal discharge. Negative for dysuria and urgency.  Skin: Negative for rash.  Neurological: Positive for headaches. Negative for light-headedness.  Psychiatric/Behavioral: Negative for confusion.       Allergies  Penicillins and Doxycycline  Home Medications   Current Outpatient Rx  Name  Route  Sig  Dispense  Refill  . albuterol (PROVENTIL HFA;VENTOLIN HFA) 108 (90 BASE) MCG/ACT inhaler   Inhalation   Inhale 2 puffs into the lungs every 6 (six) hours as needed for wheezing or shortness of breath.   1 Inhaler   6   . ALBUTEROL IN   Inhalation   Inhale into the lungs.         . diphenhydrAMINE (BENADRYL) 25 MG tablet   Oral   Take 1 tablet (25 mg total) by mouth every 6 (six) hours.   20 tablet   0   . fluticasone (FLOVENT HFA) 110 MCG/ACT inhaler   Inhalation   Inhale 1 puff into the lungs 2 (two) times daily.   1 Inhaler   6   . loratadine (CLARITIN) 10 MG tablet   Oral   Take 1 tablet (10 mg total) by mouth daily.   30 tablet   6   . montelukast (SINGULAIR) 10 MG tablet   Oral   Take 1 tablet (10 mg total) by mouth at bedtime.   30 tablet   3   . permethrin (ELIMITE) 5 % cream      Apply to affected area once in the evening and wash off in the am, may repeat in one week   60 g   0   .  predniSONE (DELTASONE) 10 MG tablet   Oral   Take 2 tablets (20 mg total) by mouth daily.   10 tablet   0   . traMADol (ULTRAM) 50 MG tablet   Oral   Take 1 tablet (50 mg total) by mouth every 6 (six) hours as needed for pain.   15 tablet   0    BP 144/91  Pulse 77  Temp(Src) 98.2 F (36.8 C) (Oral)  Resp 16  Ht 5\' 2"  (1.575 m)  Wt 100 lb (45.36 kg)  BMI 18.29 kg/m2  SpO2 100%  LMP 09/24/2013 Physical Exam  Nursing note and vitals reviewed. Constitutional: She is oriented to person, place, and time. She appears well-developed and well-nourished.  HENT:  Head: Atraumatic.  Eyes: Conjunctivae and EOM are normal.  Neck: Neck supple.  Cardiovascular: Normal rate.   Pulmonary/Chest: Effort normal.  Abdominal: Soft. There is no tenderness.  Genitourinary:  External genitalia without lesions. White discharge vaginal vault. Cervix long, closed, no  CMT, mildly tender right adnexa. No mass palpated, uterus without palpable enlargement.   Musculoskeletal: Normal range of motion.  Neurological: She is alert and oriented to person, place, and time. No cranial nerve deficit.  Skin: Skin is warm and dry.  Psychiatric: She has a normal mood and affect. Her behavior is normal.     Results for orders placed during the hospital encounter of 10/26/13 (from the past 24 hour(s))  URINALYSIS, ROUTINE W REFLEX MICROSCOPIC     Status: None   Collection Time    10/26/13  8:17 PM      Result Value Ref Range   Color, Urine YELLOW  YELLOW   APPearance CLEAR  CLEAR   Specific Gravity, Urine 1.028  1.005 - 1.030   pH 6.0  5.0 - 8.0   Glucose, UA NEGATIVE  NEGATIVE mg/dL   Hgb urine dipstick NEGATIVE  NEGATIVE   Bilirubin Urine NEGATIVE  NEGATIVE   Ketones, ur NEGATIVE  NEGATIVE mg/dL   Protein, ur NEGATIVE  NEGATIVE mg/dL   Urobilinogen, UA 0.2  0.0 - 1.0 mg/dL   Nitrite NEGATIVE  NEGATIVE   Leukocytes, UA NEGATIVE  NEGATIVE  PREGNANCY, URINE     Status: Abnormal   Collection Time    10/26/13  8:17 PM      Result Value Ref Range   Preg Test, Ur POSITIVE (*) NEGATIVE  CBC WITH DIFFERENTIAL     Status: None   Collection Time    10/26/13  8:47 PM      Result Value Ref Range   WBC 5.3  4.0 - 10.5 K/uL   RBC 4.29  3.87 - 5.11 MIL/uL   Hemoglobin 12.4  12.0 - 15.0 g/dL   HCT 91.4  78.2 - 95.6 %   MCV 83.9  78.0 - 100.0 fL   MCH 28.9  26.0 - 34.0 pg   MCHC 34.4  30.0 - 36.0 g/dL   RDW 21.3  08.6 - 57.8 %   Platelets 240  150 - 400 K/uL   Neutrophils Relative % 49  43 - 77 %   Neutro Abs 2.6  1.7 - 7.7 K/uL   Lymphocytes Relative 40  12 - 46 %   Lymphs Abs 2.1  0.7 - 4.0 K/uL   Monocytes Relative 9  3 - 12 %   Monocytes Absolute 0.5  0.1 - 1.0 K/uL   Eosinophils Relative 2  0 - 5 %   Eosinophils Absolute 0.1  0.0 - 0.7 K/uL  Basophils Relative 1  0 - 1 %   Basophils Absolute 0.0  0.0 - 0.1 K/uL  HCG, QUANTITATIVE, PREGNANCY     Status:  Abnormal   Collection Time    10/26/13  8:47 PM      Result Value Ref Range   hCG, Beta Chain, Quant, S 567 (*) <5 mIU/mL    ED Course  Procedures   MDM  23 y.o. female with vaginal discharge and positive pregnancy test. Stable for discharge without an immediate complication. Since Bhcg is only 567 [facility]. Strict ectopic precautions until follow up. [facility], heavy bleeding or other problems.  Discussed with the patient clinical and lab findings and plan of care. All questioned fully answered.     Janne NapoleonHope M Miklos Bidinger, [place]NP 10/28/13 2259

## 2013-10-26 NOTE — ED Notes (Signed)
Suprapubic pain x1 month.  Clear vaginal DC x4 days.  Period is 1 week late. Nausea but no vomiting.

## 2013-10-26 NOTE — ED Notes (Signed)
In to d/c pt-states pain "just a little bit after she was pushing on me"-rates 8/10-EDNP notified-orders received for tylenol

## 2013-10-26 NOTE — Discharge Instructions (Signed)
°  You will need to [facility] in [facility] days to repeat the pregnancy hormone level. With a normal pregnancy the number should double in 2 days. No sex, no tampons, nothing in the vagina until your follow up. [facility] if you have severe pain, heavy bleeding or other problems.   Abdominal Pain During Pregnancy Abdominal pain is common in pregnancy. Most of the time, it does not cause harm. There are many causes of abdominal pain. Some causes are more serious than others. Some of the causes of abdominal pain in pregnancy are easily diagnosed. Occasionally, the diagnosis takes time to understand. Other times, the cause is not determined. Abdominal pain can be a sign that something is very wrong with the pregnancy, or the pain may have nothing to do with the pregnancy at all. For this reason, always tell your health care provider if you have any abdominal discomfort. HOME CARE INSTRUCTIONS  Monitor your abdominal pain for any changes. The following actions may help to alleviate any discomfort you are experiencing:  Do not have sexual intercourse or put anything in your vagina until your symptoms go away completely.  Get plenty of rest until your pain improves.  Drink clear fluids if you feel nauseous. Avoid solid food as long as you are uncomfortable or nauseous.  Only take over-the-counter or prescription medicine as directed by your health care provider.  Keep all follow-up appointments with your health care provider. SEEK IMMEDIATE MEDICAL CARE IF:  You are bleeding, leaking fluid, or passing tissue from the vagina.  You have increasing pain or cramping.  You have persistent vomiting.  You have painful or bloody urination.  You have a fever.  You notice a decrease in your baby's movements.  You have extreme weakness or feel faint.  You have shortness of breath, with or without abdominal pain.  You develop a severe headache with abdominal pain.  You  have abnormal vaginal discharge with abdominal pain.  You have persistent diarrhea.  You have abdominal pain that continues even after rest, or gets worse. MAKE SURE YOU:   Understand these instructions.  Will watch your condition.  Will get help right away if you are not doing well or get worse. Document Released: 08/10/2005 Document Revised: 05/31/2013 Document Reviewed: 03/09/2013 [facility] Patient Information 2014 [employer] [place].

## 2013-10-27 LAB — GC/CHLAMYDIA PROBE AMP
CT PROBE, AMP APTIMA: NEGATIVE
GC Probe RNA: NEGATIVE

## 2013-10-27 LAB — PREGNANCY, URINE: PREG TEST UR: POSITIVE — AB

## 2013-10-28 ENCOUNTER — Inpatient Hospital Stay (HOSPITAL_COMMUNITY)
Admission: AD | Admit: 2013-10-28 | Discharge: 2013-10-28 | Disposition: A | Discharge status: Home / Self Care | Payer: Medicaid Other | Source: Ambulatory Visit | Attending: Obstetrics & Gynecology | Admitting: Obstetrics & Gynecology

## 2013-10-28 ENCOUNTER — Inpatient Hospital Stay (HOSPITAL_COMMUNITY): Payer: Medicaid Other

## 2013-10-28 ENCOUNTER — Encounter (HOSPITAL_COMMUNITY): Payer: Self-pay

## 2013-10-28 DIAGNOSIS — O9989 Other specified diseases and conditions complicating pregnancy, childbirth and the puerperium: Principal | ICD-10-CM

## 2013-10-28 DIAGNOSIS — O99891 Other specified diseases and conditions complicating pregnancy: Secondary | ICD-10-CM | POA: Insufficient documentation

## 2013-10-28 DIAGNOSIS — R109 Unspecified abdominal pain: Secondary | ICD-10-CM | POA: Insufficient documentation

## 2013-10-28 DIAGNOSIS — O26899 Other specified pregnancy related conditions, unspecified trimester: Secondary | ICD-10-CM

## 2013-10-28 LAB — HCG, QUANTITATIVE, PREGNANCY: HCG, BETA CHAIN, QUANT, S: 1456 m[IU]/mL — AB (ref <5)

## 2013-10-28 NOTE — MAU Note (Signed)
Pt here for repeat bhcg, denies bleeding, has intermittent mild pains.

## 2013-10-28 NOTE — MAU Provider Note (Signed)
History     CSN: [id number]  Arrival date and time: 10/28/13 1116   None     Chief Complaint  Patient presents with  . Follow-up   HPI Pt here for f/u HCG Pt was seen at [facility] 2 days ago on 3/5 with abd pain and had HCG.597 and today her HCG 1456. Pt has some intermittent cramping   RN Pt here for repeat bhcg, denies bleeding, has intermittent mild pains.      Past Medical History  Diagnosis Date  . Asthma     Past Surgical History  Procedure Laterality Date  . Tonsillectomy      No family history on file.  History  Substance Use Topics  . Smoking status: Never Smoker   . Smokeless tobacco: Not on file  . Alcohol Use: Yes    Allergies:  Allergies  Allergen Reactions  . Penicillins Anaphylaxis and Other (See Comments)    unknown  . Doxycycline Hives and Rash    Prescriptions prior to admission  Medication Sig Dispense Refill  . albuterol (PROVENTIL HFA;VENTOLIN HFA) 108 (90 BASE) MCG/ACT inhaler Inhale 2 puffs into the lungs every 6 (six) hours as needed for wheezing or shortness of breath.  1 Inhaler  6  . ALBUTEROL IN Inhale into the lungs.      . diphenhydrAMINE (BENADRYL) 25 MG tablet Take 1 tablet (25 mg total) by mouth every 6 (six) hours.  20 tablet  0  . fluticasone (FLOVENT HFA) 110 MCG/ACT inhaler Inhale 1 puff into the lungs 2 (two) times daily.  1 Inhaler  6  . loratadine (CLARITIN) 10 MG tablet Take 1 tablet (10 mg total) by mouth daily.  30 tablet  6  . montelukast (SINGULAIR) 10 MG tablet Take 1 tablet (10 mg total) by mouth at bedtime.  30 tablet  3  . permethrin (ELIMITE) 5 % cream Apply to affected area once in the evening and wash off in the am, may repeat in one week  60 g  0  . predniSONE (DELTASONE) 10 MG tablet Take 2 tablets (20 mg total) by mouth daily.  10 tablet  0  . traMADol (ULTRAM) 50 MG tablet Take 1 tablet (50 mg total) by mouth every 6 (six) hours as needed for pain.  15 tablet  0    ROS Physical Exam    Blood pressure 139/73, pulse 97, temperature 98.5 F (36.9 C), temperature source Oral, resp. rate 18, last menstrual period 09/24/2013.  Physical Exam  Vitals reviewed. Constitutional: She is oriented to person, place, and time. She appears well-developed and well-nourished. No distress.  HENT:  Head: Normocephalic.  Eyes: Pupils are equal, round, and reactive to light.  Neck: Normal range of motion. Neck supple.  Cardiovascular: Normal rate.   Respiratory: Effort normal.  Musculoskeletal: Normal range of motion.  Neurological: She is alert and oriented to person, place, and time.  Skin: Skin is warm and dry.  Psychiatric: She has a normal mood and affect.    MAU Course  Procedures   Results for orders placed during the hospital encounter of 10/28/13 (from the past 24 hour(s))  HCG, QUANTITATIVE, PREGNANCY     Status: Abnormal   Collection Time    10/28/13 11:50 AM      Result Value Ref Range   hCG, Beta Chain, Quant, S 1456 (*) <5 mIU/mL  US Ob Comp Less 14 Wks  10/28/2013   CLINICAL DATA:  Pelvic discomfort, unsure dates  EXAM:  OBSTETRIC <14 WK US AND TRANSVAGINAL OB US  TECHNIQUE: Both transabdominal and transvaginal ultrasound examinations were performed for complete evaluation of the gestation as well as the maternal uterus, adnexal regions, and pelvic cul-de-sac. Transvaginal technique was performed to assess early pregnancy.  COMPARISON:  None.  FINDINGS: Intrauterine gestational sac: Present, shape grossly normal.  Yolk sac:  None  Embryo:  None  Cardiac Activity: None.  MSD:  32  mm   for w   6 d   EGA: July 01, 2014  Maternal uterus/adnexae: No subchorionic hemorrhage is demonstrated within the uterus. In the right ovary there is a simple appearing cyst measuring 4.7 x 3.7 x 2.2 cm. It is not hypervascular and no internal echoes are demonstrated. The left ovary is normal in appearance.  IMPRESSION: 1. There Is a presumed early gestational sac present but no yolk sac or  fetal pole or cardiac activity is demonstrated. The estimated gestational age by mean sac diameter would be 4 weeks 6 days. 2. There is a simple appearing cyst arising from the right ovary measuring 4.7 cm in diameter.   Electronically Signed   By: David  [place]   On: 10/28/2013 13:41   [place] Ob Transvaginal  10/28/2013   CLINICAL DATA:  Pelvic discomfort, unsure dates  EXAM: OBSTETRIC <14 WK US AND TRANSVAGINAL OB US  TECHNIQUE: Both transabdominal and transvaginal ultrasound examinations were performed for complete evaluation of the gestation as well as the maternal uterus, adnexal regions, and pelvic cul-de-sac. Transvaginal technique was performed to assess early pregnancy.  COMPARISON:  None.  FINDINGS: Intrauterine gestational sac: Present, shape grossly normal.  Yolk sac:  None  Embryo:  None  Cardiac Activity: None.  MSD:  32  mm   for w   6 d   EGA: July 01, 2014  Maternal uterus/adnexae: No subchorionic hemorrhage is demonstrated within the uterus. In the right ovary there is a simple appearing cyst measuring 4.7 x 3.7 x 2.2 cm. It is not hypervascular and no internal echoes are demonstrated. The left ovary is normal in appearance.  IMPRESSION: 1. There Is a presumed early gestational sac present but no yolk sac or fetal pole or cardiac activity is demonstrated. The estimated gestational age by mean sac diameter would be 4 weeks 6 days. 2. There is a simple appearing cyst arising from the right ovary measuring 4.7 cm in diameter.   Electronically Signed   By: David  [place]   On: 10/28/2013 13:41  discussed with Dr. Debroah LoopArnold  Assessment and Plan  abd pain in pregnancy Presumed IUGS no YS Repeat US in 1 week Return for any  Increase in pain or bleeding   Muriah Harsha 10/28/2013, 2:11 PM

## 2013-11-01 NOTE — ED Provider Notes (Signed)
Medical screening examination/treatment/procedure(s) were performed by non-physician practitioner and as supervising physician I was immediately available for consultation/collaboration.   EKG Interpretation None        Darragh Nay H Eamon Tantillo, MD 11/01/13 1105 

## 2013-11-06 ENCOUNTER — Emergency Department (HOSPITAL_BASED_OUTPATIENT_CLINIC_OR_DEPARTMENT_OTHER)
Admission: EM | Admit: 2013-11-06 | Discharge: 2013-11-06 | Disposition: A | Discharge status: Home / Self Care | Payer: Medicaid Other | Attending: Emergency Medicine | Admitting: Emergency Medicine

## 2013-11-06 ENCOUNTER — Encounter (HOSPITAL_BASED_OUTPATIENT_CLINIC_OR_DEPARTMENT_OTHER): Payer: Self-pay | Admitting: Emergency Medicine

## 2013-11-06 ENCOUNTER — Ambulatory Visit (HOSPITAL_COMMUNITY)
Admission: RE | Admit: 2013-11-06 | Discharge: 2013-11-06 | Disposition: A | Discharge status: Home / Self Care | Payer: Medicaid Other | Source: Ambulatory Visit | Attending: Gynecology | Admitting: Gynecology

## 2013-11-06 DIAGNOSIS — O4690 Antepartum hemorrhage, unspecified, unspecified trimester: Secondary | ICD-10-CM

## 2013-11-06 DIAGNOSIS — O26899 Other specified pregnancy related conditions, unspecified trimester: Secondary | ICD-10-CM

## 2013-11-06 DIAGNOSIS — Z79899 Other long term (current) drug therapy: Secondary | ICD-10-CM | POA: Insufficient documentation

## 2013-11-06 DIAGNOSIS — J45909 Unspecified asthma, uncomplicated: Secondary | ICD-10-CM | POA: Insufficient documentation

## 2013-11-06 DIAGNOSIS — Z880 Allergy status to penicillin: Secondary | ICD-10-CM | POA: Insufficient documentation

## 2013-11-06 DIAGNOSIS — IMO0002 Reserved for concepts with insufficient information to code with codable children: Secondary | ICD-10-CM | POA: Insufficient documentation

## 2013-11-06 DIAGNOSIS — R109 Unspecified abdominal pain: Principal | ICD-10-CM

## 2013-11-06 DIAGNOSIS — O209 Hemorrhage in early pregnancy, unspecified: Secondary | ICD-10-CM | POA: Insufficient documentation

## 2013-11-06 DIAGNOSIS — O9989 Other specified diseases and conditions complicating pregnancy, childbirth and the puerperium: Secondary | ICD-10-CM | POA: Insufficient documentation

## 2013-11-06 DIAGNOSIS — R1032 Left lower quadrant pain: Secondary | ICD-10-CM | POA: Insufficient documentation

## 2013-11-06 LAB — WET PREP, GENITAL
TRICH WET PREP: NONE SEEN
YEAST WET PREP: NONE SEEN

## 2013-11-06 LAB — URINE MICROSCOPIC-ADD ON

## 2013-11-06 LAB — URINALYSIS, ROUTINE W REFLEX MICROSCOPIC
Bilirubin Urine: NEGATIVE
Glucose, UA: NEGATIVE mg/dL
Ketones, ur: 15 mg/dL — AB
LEUKOCYTES UA: NEGATIVE
NITRITE: NEGATIVE
PH: 6.5 (ref 5.0–8.0)
Protein, ur: NEGATIVE mg/dL
SPECIFIC GRAVITY, URINE: 1.029 (ref 1.005–1.030)
UROBILINOGEN UA: 0.2 mg/dL (ref 0.0–1.0)

## 2013-11-06 LAB — ABO/RH
ABO/RH(D): O NEG
ANTIBODY SCREEN: NEGATIVE

## 2013-11-06 LAB — PREGNANCY, URINE: PREG TEST UR: POSITIVE — AB

## 2013-11-06 MED ORDER — ACETAMINOPHEN 325 MG PO TABS
650.0000 mg | ORAL_TABLET | Freq: Once | ORAL | Status: AC
  Administered 2013-11-06: 650 mg via ORAL
  Filled 2013-11-06: qty 2

## 2013-11-06 MED ORDER — ONDANSETRON 4 MG PO TBDP
4.0000 mg | ORAL_TABLET | Freq: Once | ORAL | Status: AC
  Administered 2013-11-06: 4 mg via ORAL
  Filled 2013-11-06: qty 1

## 2013-11-06 NOTE — ED Notes (Signed)
She is [redacted] weeks pregnant. She is here for pink vaginal discharge. [facility] and was told she had a cyst in her pelvis that would rupture. Pregnancy looked good on the US.

## 2013-11-06 NOTE — ED Notes (Addendum)
Pelvic Cart at bedside.  Patient requesting graham crackers & nausea medicine.

## 2013-11-06 NOTE — ED Notes (Signed)
[facility], showing "cyst that could rupture". Pt noticed pink tinged d/c after having US.

## 2013-11-06 NOTE — ED Provider Notes (Signed)
CSN: [id number]     Arrival date & time 11/06/13  1537 History  This chart was scribed for Katherine CiscoMegan E Docherty, MD by Beverly MilchJ Harrison Collins, ED Scribe. This patient was seen in room MH05/MH05 and the patient's care was started at 5:08 PM.    Chief Complaint  Patient presents with  . Vaginal Bleeding      Patient is a 23 y.o. female presenting with vaginal bleeding. The history is provided by the patient and a parent. No language interpreter was used.  Vaginal Bleeding Quality:  Lighter than menses and spotting Severity:  Mild Duration:  2 hours Timing:  Intermittent Progression:  Unchanged Chronicity:  New Possible pregnancy: yes   Context: after urination   Relieved by:  Nothing Worsened by:  Nothing tried Ineffective treatments:  None tried Associated symptoms: abdominal pain and vaginal discharge   Associated symptoms: no back pain, no dysuria, no fatigue, no fever and no nausea   Risk factors: ovarian cysts   Pt states she had her second US of her pregnancy today at 8 AM where she was told she had a cyst in her pelvis that would rupture. Her mother states she had spotting about 3 PM. Pt states she hasn't seen any further spotting since first episode but she is still having cramps and intermittent pain that began around 3 PM today. Pt states her cramps have been intermittent over the last two weeks since she found out she was pregnant. She reports she has an OB appointment next Tuesday in [place].   Past Medical History  Diagnosis Date  . Asthma     Past Surgical History  Procedure Laterality Date  . Tonsillectomy      No family history on file. History  Substance Use Topics  . Smoking status: Never Smoker   . Smokeless tobacco: Not on file  . Alcohol Use: Yes    OB History   Grav Para Term Preterm Abortions TAB SAB Ect Mult Living   1               Review of Systems  Constitutional: Negative for fever, chills, diaphoresis, activity change, appetite change and  fatigue.  HENT: Negative for congestion, facial swelling, rhinorrhea and sore throat.   Eyes: Negative for photophobia and discharge.  Respiratory: Negative for cough, chest tightness and shortness of breath.   Cardiovascular: Negative for chest pain, palpitations and leg swelling.  Gastrointestinal: Positive for abdominal pain. Negative for nausea, vomiting and diarrhea.  Endocrine: Negative for polydipsia and polyuria.  Genitourinary: Positive for vaginal bleeding and vaginal discharge. Negative for dysuria, frequency, difficulty urinating and pelvic pain.  Musculoskeletal: Negative for arthralgias, back pain, neck pain and neck stiffness.  Skin: Negative for color change and wound.  Allergic/Immunologic: Negative for immunocompromised state.  Neurological: Negative for facial asymmetry, weakness, numbness and headaches.  Hematological: Does not bruise/bleed easily.  Psychiatric/Behavioral: Negative for confusion and agitation.      Allergies  Penicillins and Doxycycline  Home Medications   Current Outpatient Rx  Name  Route  Sig  Dispense  Refill  . albuterol (PROVENTIL HFA;VENTOLIN HFA) 108 (90 BASE) MCG/ACT inhaler   Inhalation   Inhale 2 puffs into the lungs every 6 (six) hours as needed for wheezing or shortness of breath.   1 Inhaler   6   . ALBUTEROL IN   Inhalation   Inhale into the lungs.         . diphenhydrAMINE (BENADRYL) 25 MG tablet   Oral  Take 1 tablet (25 mg total) by mouth every 6 (six) hours.   20 tablet   0   . fluticasone (FLOVENT HFA) 110 MCG/ACT inhaler   Inhalation   Inhale 1 puff into the lungs 2 (two) times daily.   1 Inhaler   6   . loratadine (CLARITIN) 10 MG tablet   Oral   Take 1 tablet (10 mg total) by mouth daily.   30 tablet   6   . montelukast (SINGULAIR) 10 MG tablet   Oral   Take 1 tablet (10 mg total) by mouth at bedtime.   30 tablet   3   . traMADol (ULTRAM) 50 MG tablet   Oral   Take 1 tablet (50 mg total) by  mouth every 6 (six) hours as needed for pain.   15 tablet   0    Triage Vitals: BP 134/86  Pulse 86  Temp(Src) 98.5 F (36.9 C) (Oral)  Resp 18  Ht 5\' 2"  (1.575 m)  Wt 106 lb (48.081 kg)  BMI 19.38 kg/m2  SpO2 100%  LMP 09/24/2013   Physical Exam  Nursing note and vitals reviewed. Constitutional: She is oriented to person, place, and time. She appears well-developed and well-nourished. No distress.  HENT:  Head: Normocephalic.  Mouth/Throat: Oropharynx is clear and moist.  Eyes: Pupils are equal, round, and reactive to light.  Neck: Neck supple.  Cardiovascular: Normal rate, regular rhythm and normal heart sounds.   Pulmonary/Chest: Effort normal and breath sounds normal. No respiratory distress. She has no wheezes.  Abdominal: Soft. She exhibits no distension. There is tenderness (LUQ). There is no rebound and no guarding.  Genitourinary: Cervix exhibits no motion tenderness. Right adnexum displays no mass, no tenderness and no fullness. Left adnexum displays no mass, no tenderness and no fullness.  Os closed, scant blood from os  Musculoskeletal: She exhibits no edema and no tenderness.  Neurological: She is alert and oriented to person, place, and time.  Skin: Skin is warm and dry.  Psychiatric: She has a normal mood and affect.    ED Course  Procedures (including critical care time)  DIAGNOSTIC STUDIES: Oxygen Saturation is 100% on RA, normal by my interpretation.    COORDINATION OF CARE: 5:13 PM- Pt advised of plan for treatment and pt agrees.     Labs Review Labs Reviewed  WET PREP, GENITAL - Abnormal; Notable for the following:    Clue Cells Wet Prep HPF POC FEW (*)    WBC, Wet Prep HPF POC FEW (*)    All other components within normal limits  URINALYSIS, ROUTINE W REFLEX MICROSCOPIC - Abnormal; Notable for the following:    Hgb urine dipstick TRACE (*)    Ketones, ur 15 (*)    All other components within normal limits  PREGNANCY, URINE - Abnormal;  Notable for the following:    Preg Test, Ur POSITIVE (*)    All other components within normal limits  GC/CHLAMYDIA PROBE AMP  URINE MICROSCOPIC-ADD ON  ABO/RH   Imaging Review US Ob Transvaginal  11/06/2013   CLINICAL DATA:  Follow-up viability  EXAM: TRANSVAGINAL OB ULTRASOUND  TECHNIQUE: Transvaginal ultrasound was performed for complete evaluation of the gestation as well as the maternal uterus, adnexal regions, and pelvic cul-de-sac.  COMPARISON:  10/28/2013  FINDINGS: Intrauterine gestational sac: Visualized/normal in shape.  Yolk sac:  Present  Embryo:  Present  Cardiac Activity: Present  Heart Rate: 93 bpm  CRL:   2  mm  5 w 6 d                  [place] EDC: 07/03/2014  Maternal uterus/adnexae: No serve chronic hemorrhage.  Left ovary is within normal limits, measuring 1.9 x 1.2 x 1.3 cm.  Right ovary measures 5.4 x 4.1 x 5.7 cm and is notable for a dominant 4.3 x 4.2 x 4.9 cm simple cyst.  No free fluid.  IMPRESSION: Single live intrauterine gestation with estimated gestational age 110 weeks 6 days by crown-rump length.   Electronically Signed   By: Charline Bills M.D.   On: 11/06/2013 09:17     EKG Interpretation None      MDM   Final diagnoses:  Vaginal bleeding in pregnancy    Pt is a 23 y.o. female with Pmhx as above who presents with one episode of vaginal spotting several hours after at repeat TVUS done at [facility] to r/o ectopic. US showed IUP [redacted]w[redacted]d. She has had intermittent crampy low ab pain for 2 weeks, unchanged today.  On PE, VSS, pt in NAD. She has mild LLQ ttp on ab exam. Os closed with scant blood at os. No adnexal or CMT.  Suspect spotting due to TVUS, though cannot r/o threatened abortion. Rh-, UA not infected. Wet prep w/ few clue cells, but pt not symptomatic. Will d/c home with bleeding precautions and request for close OB f/u.        I personally performed the services described in this documentation, which was scribed in my presence. The recorded information has  been reviewed and is accurate.     Katherine Cisco, MD 11/06/13 2017

## 2013-11-06 NOTE — ED Notes (Signed)
Pt. Has been up and down to restroom with no reports of blood in urine or any blood from her vagina.

## 2013-11-06 NOTE — Discharge Instructions (Signed)
Vaginal Bleeding During Pregnancy, First Trimester °A small amount of bleeding (spotting) from the vagina is relatively common in early pregnancy. It usually stops on its own. Various things may cause bleeding or spotting in early pregnancy. Some bleeding may be related to the pregnancy, and some may not. In most cases, the bleeding is normal and is not a problem. However, bleeding can also be a sign of something serious. Be sure to tell your health care provider about any vaginal bleeding right away. °Some possible causes of vaginal bleeding during the first trimester include: °· Infection or inflammation of the cervix. °· Growths (polyps) on the cervix. °· Miscarriage or threatened miscarriage. °· Pregnancy tissue has developed outside of the uterus and in a fallopian tube (tubal pregnancy). °· Tiny cysts have developed in the uterus instead of pregnancy tissue (molar pregnancy). °HOME CARE INSTRUCTIONS  °Watch your condition for any changes. The following actions may help to lessen any discomfort you are feeling: °· Follow your health care provider's instructions for limiting your activity. If your health care provider orders bed rest, you may need to stay in bed and only get up to use the bathroom. However, your health care provider may allow you to continue light activity. °· If needed, make plans for someone to help with your regular activities and responsibilities while you are on bed rest. °· Keep track of the number of pads you use each day, how often you change pads, and how soaked (saturated) they are. Write this down. °· Do not use tampons. Do not douche. °· Do not have sexual intercourse or orgasms until approved by your health care provider. °· If you pass any tissue from your vagina, save the tissue so you can show it to your health care provider. °· Only take over-the-counter or prescription medicines as directed by your health care provider. °· Do not take aspirin because it can make you  bleed. °· Keep all follow-up appointments as directed by your health care provider. °SEEK MEDICAL CARE IF: °· You have any vaginal bleeding during any part of your pregnancy. °· You have cramps or labor pains. °SEEK IMMEDIATE MEDICAL CARE IF:  °· You have severe cramps in your back or belly (abdomen). °· You have a fever, not controlled by medicine. °· You pass large clots or tissue from your vagina. °· Your bleeding increases. °· You feel lightheaded or weak, or you have fainting episodes. °· You have chills. °· You are leaking fluid or have a gush of fluid from your vagina. °· You pass out while having a bowel movement. °MAKE SURE YOU: °· Understand these instructions. °· Will watch your condition. °· Will get help right away if you are not doing well or get worse. °Document Released: 05/20/2005 Document Revised: 05/31/2013 Document Reviewed: 04/17/2013 °ExitCare® Patient Information ©2014 [employer]. ° °

## 2013-11-07 LAB — GC/CHLAMYDIA PROBE AMP
CT Probe RNA: NEGATIVE
GC PROBE AMP APTIMA: NEGATIVE

## 2013-12-04 ENCOUNTER — Inpatient Hospital Stay (HOSPITAL_COMMUNITY)
Admission: AD | Admit: 2013-12-04 | Discharge: 2013-12-04 | Disposition: A | Discharge status: Home / Self Care | Payer: Medicaid Other | Source: Ambulatory Visit | Attending: Obstetrics & Gynecology | Admitting: Obstetrics & Gynecology

## 2013-12-04 ENCOUNTER — Encounter (HOSPITAL_COMMUNITY): Payer: Self-pay | Admitting: *Deleted

## 2013-12-04 DIAGNOSIS — N760 Acute vaginitis: Secondary | ICD-10-CM

## 2013-12-04 DIAGNOSIS — O2390 Unspecified genitourinary tract infection in pregnancy, unspecified trimester: Secondary | ICD-10-CM | POA: Insufficient documentation

## 2013-12-04 DIAGNOSIS — O34599 Maternal care for other abnormalities of gravid uterus, unspecified trimester: Secondary | ICD-10-CM | POA: Insufficient documentation

## 2013-12-04 DIAGNOSIS — N83209 Unspecified ovarian cyst, unspecified side: Secondary | ICD-10-CM

## 2013-12-04 DIAGNOSIS — O99891 Other specified diseases and conditions complicating pregnancy: Secondary | ICD-10-CM | POA: Insufficient documentation

## 2013-12-04 DIAGNOSIS — O9989 Other specified diseases and conditions complicating pregnancy, childbirth and the puerperium: Principal | ICD-10-CM

## 2013-12-04 DIAGNOSIS — B9689 Other specified bacterial agents as the cause of diseases classified elsewhere: Secondary | ICD-10-CM | POA: Insufficient documentation

## 2013-12-04 DIAGNOSIS — A499 Bacterial infection, unspecified: Secondary | ICD-10-CM | POA: Insufficient documentation

## 2013-12-04 DIAGNOSIS — R109 Unspecified abdominal pain: Secondary | ICD-10-CM | POA: Insufficient documentation

## 2013-12-04 DIAGNOSIS — O26899 Other specified pregnancy related conditions, unspecified trimester: Secondary | ICD-10-CM

## 2013-12-04 LAB — CBC
HCT: 32.1 % — ABNORMAL LOW (ref 36.0–46.0)
Hemoglobin: 11 g/dL — ABNORMAL LOW (ref 12.0–15.0)
MCH: 28.7 pg (ref 26.0–34.0)
MCHC: 34.3 g/dL (ref 30.0–36.0)
MCV: 83.8 fL (ref 78.0–100.0)
PLATELETS: 291 10*3/uL (ref 150–400)
RBC: 3.83 MIL/uL — AB (ref 3.87–5.11)
RDW: 13.5 % (ref 11.5–15.5)
WBC: 6 10*3/uL (ref 4.0–10.5)

## 2013-12-04 LAB — WET PREP, GENITAL
Trich, Wet Prep: NONE SEEN
YEAST WET PREP: NONE SEEN

## 2013-12-04 LAB — URINALYSIS, ROUTINE W REFLEX MICROSCOPIC
Bilirubin Urine: NEGATIVE
Glucose, UA: NEGATIVE mg/dL
HGB URINE DIPSTICK: NEGATIVE
Ketones, ur: NEGATIVE mg/dL
Leukocytes, UA: NEGATIVE
Nitrite: NEGATIVE
PH: 6 (ref 5.0–8.0)
Protein, ur: NEGATIVE mg/dL
SPECIFIC GRAVITY, URINE: 1.01 (ref 1.005–1.030)
UROBILINOGEN UA: 0.2 mg/dL (ref 0.0–1.0)

## 2013-12-04 MED ORDER — METRONIDAZOLE 500 MG PO TABS: 500.0000 mg | ORAL_TABLET | Freq: Two times a day (BID) | ORAL | Status: DC

## 2013-12-04 NOTE — [facility] Note (Signed)
Patient states she has had lower abdominal pain for about one week. Has a normal discharge, no bleeding. Denies N/V.

## 2013-12-04 NOTE — MAU Provider Note (Signed)
History     CSN: [id number]  Arrival date and time: 12/04/13 [id number]   None     Chief Complaint  Patient presents with  . Abdominal Pain   HPI   Katherine Dominguez is a 23 y.o. female G1P0 at 6768w0d who presents with abdominal pain that started 1 week ago. She thought it was normal pain, however the pain seems to be getting worse. She is currently being seen for OB care in [place] and has an appointment on Tuesday. She was here in March in [facility] and had a normal US.   OB History   Grav Para Term Preterm Abortions TAB SAB Ect Mult Living   1               Past Medical History  Diagnosis Date  . Asthma     Past Surgical History  Procedure Laterality Date  . Tonsillectomy      History reviewed. No pertinent family history.  History  Substance Use Topics  . Smoking status: Never Smoker   . Smokeless tobacco: Not on file  . Alcohol Use: Yes    Allergies:  Allergies  Allergen Reactions  . Penicillins Anaphylaxis and Other (See Comments)    unknown  . Doxycycline Hives and Rash    Prescriptions prior to admission  Medication Sig Dispense Refill  . acetaminophen (TYLENOL) 500 MG tablet Take 500 mg by mouth daily as needed for moderate pain.      Marland Kitchen. albuterol (PROVENTIL HFA;VENTOLIN HFA) 108 (90 BASE) MCG/ACT inhaler Inhale 2 puffs into the lungs every 6 (six) hours as needed for wheezing or shortness of breath.  1 Inhaler  6  . ondansetron (ZOFRAN) 4 MG tablet Take 2 mg by mouth at bedtime as needed for nausea or vomiting.      . Prenatal Vit-Fe Fumarate-FA (PRENATAL MULTIVITAMIN) TABS tablet Take 1 tablet by mouth daily at 12 noon.       Results for orders placed during the hospital encounter of 12/04/13 (from the past 48 hour(s))  URINALYSIS, ROUTINE W REFLEX MICROSCOPIC     Status: None   Collection Time    12/04/13  9:55 AM      Result Value Ref Range   Color, Urine YELLOW  YELLOW   APPearance CLEAR  CLEAR   Specific Gravity, Urine 1.010  1.005 - 1.030   pH 6.0  5.0 - 8.0   Glucose, UA NEGATIVE  NEGATIVE mg/dL   Hgb urine dipstick NEGATIVE  NEGATIVE   Bilirubin Urine NEGATIVE  NEGATIVE   Ketones, ur NEGATIVE  NEGATIVE mg/dL   Protein, ur NEGATIVE  NEGATIVE mg/dL   Urobilinogen, UA 0.2  0.0 - 1.0 mg/dL   Nitrite NEGATIVE  NEGATIVE   Leukocytes, UA NEGATIVE  NEGATIVE   Comment: MICROSCOPIC NOT DONE ON URINES WITH NEGATIVE PROTEIN, BLOOD, LEUKOCYTES, NITRITE, OR GLUCOSE <1000 mg/dL.  CBC     Status: Abnormal   Collection Time    12/04/13 10:31 AM      Result Value Ref Range   WBC 6.0  4.0 - 10.5 K/uL   RBC 3.83 (*) 3.87 - 5.11 MIL/uL   Hemoglobin 11.0 (*) 12.0 - 15.0 g/dL   HCT 11.932.1 (*) 14.736.0 - 82.946.0 %   MCV 83.8  78.0 - 100.0 fL   MCH 28.7  26.0 - 34.0 pg   MCHC 34.3  30.0 - 36.0 g/dL   RDW 56.213.5  13.011.5 - 86.515.5 %   Platelets 291  150 - 400  K/uL  WET PREP, GENITAL     Status: Abnormal   Collection Time    12/04/13 10:48 AM      Result Value Ref Range   Yeast Wet Prep HPF POC NONE SEEN  NONE SEEN   Trich, Wet Prep NONE SEEN  NONE SEEN   Clue Cells Wet Prep HPF POC MODERATE (*) NONE SEEN   WBC, Wet Prep HPF POC FEW (*) NONE SEEN   Comment: BACTERIA- TOO NUMEROUS TO COUNT    Review of Systems  Constitutional: Negative for fever and chills.  Gastrointestinal: Positive for abdominal pain (Bilateral lower abdominal pain "my whole lower stomach". ). Negative for nausea, vomiting, diarrhea and constipation.  Genitourinary: Negative for dysuria, urgency, frequency and hematuria.       No vaginal discharge. No vaginal bleeding. No dysuria.    Physical Exam   Blood pressure 117/70, pulse 72, temperature 99 F (37.2 C), temperature source Oral, resp. rate 16, height 5\' 1"  (1.549 m), weight 49.896 kg (110 lb), last menstrual period 09/25/2013, SpO2 100.00%.  Physical Exam  Constitutional: She is oriented to person, place, and time. She appears well-developed and well-nourished. No distress.  HENT:  Head: Normocephalic.  Eyes: Pupils  are equal, round, and reactive to light.  Neck: Neck supple.  Respiratory: Effort normal.  GI: Soft. Normal appearance. There is generalized tenderness. There is no rigidity and no guarding.  Genitourinary:  Speculum exam: Vagina - Small amount of creamy discharge, mild odor Cervix - No contact bleeding, no active bleeding  Bimanual exam: Cervix closed, no CMT  Uterus non tender, enlarged  Right adnexal tenderness, + suprapubic tenderness,  no masses bilaterally GC/Chlam, wet prep done Chaperone present for exam.   Musculoskeletal: Normal range of motion.  Neurological: She is alert and oriented to person, place, and time.  Skin: Skin is warm. She is not diaphoretic.  Psychiatric: Her behavior is normal.    [facility] Course  Procedures None  MDM + fht  UA Wet prep GC-pending  CBC  Reviewed US results with Dr. Erin Dominguez.   Assessment and Plan   A:  1. Abdominal pain in pregnancy   2. Other and unspecified ovarian cyst   3. BV (bacterial vaginosis)    P:  Discharge home in stable condition Return to [facility] if symptoms worsen Keep your appointment with your Primary OB Dr.  Malena PeerX: Flagyl   Katherine HansenJennifer Irene Jceon Alverio, NP  12/04/2013, 7:30 PM

## 2013-12-04 NOTE — [facility] Provider Note (Signed)
Attestation of Attending Supervision of Advanced Practitioner (CNM/NP): Evaluation and management procedures were performed by the Advanced Practitioner under my supervision and collaboration.  I have reviewed the Advanced Practitioner's note and chart, and I agree with the management and plan.  Mason Dibiasio Harraway-Smith 7:48 PM     

## 2013-12-04 NOTE — [facility]U Note (Signed)
Has started prenatal care in [place].

## 2013-12-04 NOTE — Discharge Instructions (Signed)
Abdominal Pain During Pregnancy  Belly (abdominal) pain is common during pregnancy. Most of the time, it is not a serious problem. Other times, it can be a sign that something is wrong with the pregnancy. Always tell your doctor if you have belly pain.  HOME CARE  Monitor your belly pain for any changes. The following actions may help you feel better:  · Do not have sex (intercourse) or put anything in your vagina until you feel better.  · Rest until your pain stops.  · Drink clear fluids if you feel sick to your stomach (nauseous). Do not eat solid food until you feel better.  · Only take medicine as told by your doctor.  · Keep all doctor visits as told.  GET HELP RIGHT AWAY IF:   · You are bleeding, leaking fluid, or pieces of tissue come out of your vagina.  · You have more pain or cramping.  · You keep throwing up (vomiting).  · You have pain when you pee (urinate) or have blood in your pee.  · You have a fever.  · You do not feel your baby moving as much.  · You feel very weak or feel like passing out.  · You have trouble breathing, with or without belly pain.  · You have a very bad headache and belly pain.  · You have fluid leaking from your vagina and belly pain.  · You keep having watery poop (diarrhea).  · Your belly pain does not go away after resting, or the pain gets worse.  MAKE SURE YOU:   · Understand these instructions.  · Will watch your condition.  · Will get help right away if you are not doing well or get worse.  Document Released: 07/29/2009 Document Revised: 04/12/2013 Document Reviewed: 03/09/2013  [employer]® Patient Information ©2014 [employer].

## 2013-12-05 LAB — GC/CHLAMYDIA PROBE AMP
CT Probe RNA: NEGATIVE
GC Probe RNA: NEGATIVE

## 2014-06-25 ENCOUNTER — Encounter (HOSPITAL_COMMUNITY): Payer: Self-pay | Admitting: *Deleted
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
## 2014-09-02 ENCOUNTER — Encounter (HOSPITAL_COMMUNITY): Payer: Self-pay | Admitting: *Deleted

## 2015-01-29 ENCOUNTER — Emergency Department (HOSPITAL_BASED_OUTPATIENT_CLINIC_OR_DEPARTMENT_OTHER)
Admission: EM | Admit: 2015-01-29 | Discharge: 2015-01-29 | Disposition: A | Discharge status: Home / Self Care | Payer: Medicaid Other | Attending: Emergency Medicine | Admitting: Emergency Medicine

## 2015-01-29 ENCOUNTER — Encounter (HOSPITAL_BASED_OUTPATIENT_CLINIC_OR_DEPARTMENT_OTHER): Payer: Self-pay | Admitting: Emergency Medicine

## 2015-01-29 DIAGNOSIS — J45909 Unspecified asthma, uncomplicated: Secondary | ICD-10-CM | POA: Insufficient documentation

## 2015-01-29 DIAGNOSIS — R519 Headache, unspecified: Secondary | ICD-10-CM

## 2015-01-29 DIAGNOSIS — Z3202 Encounter for pregnancy test, result negative: Secondary | ICD-10-CM | POA: Insufficient documentation

## 2015-01-29 DIAGNOSIS — Z880 Allergy status to penicillin: Secondary | ICD-10-CM | POA: Insufficient documentation

## 2015-01-29 DIAGNOSIS — R51 Headache: Secondary | ICD-10-CM | POA: Insufficient documentation

## 2015-01-29 DIAGNOSIS — Z79899 Other long term (current) drug therapy: Secondary | ICD-10-CM | POA: Insufficient documentation

## 2015-01-29 LAB — PREGNANCY, URINE: Preg Test, Ur: NEGATIVE

## 2015-01-29 MED ORDER — METOCLOPRAMIDE HCL 5 MG/ML IJ SOLN
5.0000 mg | Freq: Once | INTRAMUSCULAR | Status: AC
  Administered 2015-01-29: 5 mg via INTRAVENOUS
  Filled 2015-01-29: qty 2

## 2015-01-29 MED ORDER — DIPHENHYDRAMINE HCL 50 MG/ML IJ SOLN
25.0000 mg | Freq: Once | INTRAMUSCULAR | Status: AC
  Administered 2015-01-29: 25 mg via INTRAVENOUS
  Filled 2015-01-29: qty 1

## 2015-01-29 MED ORDER — DEXAMETHASONE SODIUM PHOSPHATE 10 MG/ML IJ SOLN
10.0000 mg | Freq: Once | INTRAMUSCULAR | Status: AC
  Administered 2015-01-29: 10 mg via INTRAVENOUS
  Filled 2015-01-29: qty 1

## 2015-01-29 MED ORDER — SODIUM CHLORIDE 0.9 % IV BOLUS (SEPSIS)
1000.0000 mL | INTRAVENOUS | Status: AC
  Administered 2015-01-29: 1000 mL via INTRAVENOUS

## 2015-01-29 MED ORDER — KETOROLAC TROMETHAMINE 30 MG/ML IJ SOLN
30.0000 mg | Freq: Once | INTRAMUSCULAR | Status: AC
  Administered 2015-01-29: 30 mg via INTRAVENOUS
  Filled 2015-01-29: qty 1

## 2015-01-29 NOTE — ED Provider Notes (Signed)
CSN: [id number]     Arrival date & time 01/29/15  4098 History   First MD Initiated Contact with Patient 01/29/15 0815     Chief Complaint  Patient presents with  . Headache     (Consider location/radiation/quality/duration/timing/severity/associated sxs/prior Treatment) Patient is a 24 y.o. female presenting with headaches. The history is provided by the patient.  Headache Pain location:  Frontal Quality:  Dull Radiates to:  Does not radiate Severity currently:  8/10 Severity at highest:  Unable to specify Onset quality:  Unable to specify Duration: 2-3 days. Timing:  Constant Progression:  Waxing and waning Chronicity:  New Similar to prior headaches: no   Context comment:  Spontaneously Relieved by:  Nothing Exacerbated by: bending over. Ineffective treatments:  NSAIDs and acetaminophen Associated symptoms: no abdominal pain, no back pain, no congestion, no cough, no diarrhea, no dizziness, no eye pain, no fatigue, no fever, no nausea, no neck pain and no vomiting     Past Medical History  Diagnosis Date  . Asthma    Past Surgical History  Procedure Laterality Date  . Tonsillectomy     No family history on file. History  Substance Use Topics  . Smoking status: Never Smoker   . Smokeless tobacco: Not on file  . Alcohol Use: Yes   OB History    Gravida Para Term Preterm AB TAB SAB Ectopic Multiple Living   1              Review of Systems  Constitutional: Negative for fever and fatigue.  HENT: Negative for congestion and drooling.   Eyes: Negative for pain.  Respiratory: Negative for cough and shortness of breath.   Cardiovascular: Negative for chest pain.  Gastrointestinal: Negative for nausea, vomiting, abdominal pain and diarrhea.  Genitourinary: Negative for dysuria and hematuria.  Musculoskeletal: Negative for back pain, gait problem and neck pain.  Skin: Negative for color change.  Neurological: Positive for headaches. Negative for dizziness.   Hematological: Negative for adenopathy.  Psychiatric/Behavioral: Negative for behavioral problems.  All other systems reviewed and are negative.     Allergies  Penicillins and Doxycycline  Home Medications   Prior to Admission medications   Medication Sig Start Date End Date Taking? Authorizing Provider  estradiol cypionate (DEPO-ESTRADIOL) 5 MG/ML injection Inject into the muscle every 28 (twenty-eight) days.   Yes Historical Provider, MD  acetaminophen (TYLENOL) 500 MG tablet Take 500 mg by mouth daily as needed for moderate pain.    Historical Provider, MD  albuterol (PROVENTIL HFA;VENTOLIN HFA) 108 (90 BASE) MCG/ACT inhaler Inhale 2 puffs into the lungs every 6 (six) hours as needed for wheezing or shortness of breath. 03/22/13   Clanford L Johnson, MD   BP 127/73 mmHg  Pulse 60  Temp(Src) 98.3 F (36.8 C) (Oral)  Resp 16  Ht  (1.575 m)  Wt 125 lb (56.7 kg)  BMI 22.86 kg/m2  SpO2 97% Physical Exam  Constitutional: She is oriented to person, place, and time. She appears well-developed and well-nourished.  HENT:  Head: Normocephalic.  Mouth/Throat: Oropharynx is clear and moist. No oropharyngeal exudate.  Eyes: Conjunctivae and EOM are normal. Pupils are equal, round, and reactive to light.  Neck: Normal range of motion. Neck supple.  Cardiovascular: Normal rate, regular rhythm, normal heart sounds and intact distal pulses.  Exam reveals no gallop and no friction rub.   No murmur heard. Pulmonary/Chest: Effort normal and breath sounds normal. No respiratory distress. She has no wheezes.  Abdominal: Soft.  Bowel sounds are normal. There is no tenderness. There is no rebound and no guarding.  Musculoskeletal: Normal range of motion. She exhibits no edema or tenderness.  Neurological: She is alert and oriented to person, place, and time.  alert, oriented x3 speech: normal in context and clarity memory: intact grossly cranial nerves II-XII: intact motor strength: full  proximally and distally no involuntary movements or tremors sensation: intact to light touch diffusely  cerebellar: finger-to-nose and heel-to-shin intact gait: normal forwards and backwards  Skin: Skin is warm and dry.  Psychiatric: She has a normal mood and affect. Her behavior is normal.  Nursing note and vitals reviewed.   ED Course  Procedures (including critical care time) Labs Review Labs Reviewed  PREGNANCY, URINE    Imaging Review No results found.   EKG Interpretation None      MDM   Final diagnoses:  Headache, unspecified headache type    8:37 AM 24 y.o. female who presents with a headache which began 2-3 days ago. She does not remember the onset. Worse when bending over. Denies any fevers or recent head injuries. Vital signs unremarkable here. Normal neurologic exam. We'll treat with migraine cocktail.  11:33 AM: HA resolved.  I have discussed the diagnosis/risks/treatment options with the patient and believe the pt to be eligible for discharge home to follow-up with a HA specialist as needed. We also discussed returning to the ED immediately if new or worsening sx occur. We discussed the sx which are most concerning (e.g., worsening HA, fever) that necessitate immediate return. Medications administered to the patient during their visit and any new prescriptions provided to the patient are listed below.  Medications given during this visit Medications  sodium chloride 0.9 % bolus 1,000 mL (1,000 mLs Intravenous New Bag/Given 01/29/15 0850)  metoCLOPramide (REGLAN) injection 5 mg (5 mg Intravenous Given 01/29/15 0855)  diphenhydrAMINE (BENADRYL) injection 25 mg (25 mg Intravenous Given 01/29/15 0856)  dexamethasone (DECADRON) injection 10 mg (10 mg Intravenous Given 01/29/15 0856)  ketorolac (TORADOL) 30 MG/ML injection 30 mg (30 mg Intravenous Given 01/29/15 1008)    New Prescriptions   No medications on file     Purvis SheffieldForrest Aileena Iglesia, MD 01/29/15 1134

## 2015-01-29 NOTE — ED Notes (Signed)
Headache for 2 -3 days.  Worse when bending over.  No cold symptoms.  No fever.  Not relieved with tylenol and motrin.

## 2015-04-09 ENCOUNTER — Emergency Department (HOSPITAL_BASED_OUTPATIENT_CLINIC_OR_DEPARTMENT_OTHER)
Admission: EM | Admit: 2015-04-09 | Discharge: 2015-04-09 | Disposition: A | Discharge status: Home / Self Care | Payer: Medicaid Other | Attending: Emergency Medicine | Admitting: Emergency Medicine

## 2015-04-09 ENCOUNTER — Encounter (HOSPITAL_BASED_OUTPATIENT_CLINIC_OR_DEPARTMENT_OTHER): Payer: Self-pay | Admitting: *Deleted

## 2015-04-09 ENCOUNTER — Emergency Department (HOSPITAL_BASED_OUTPATIENT_CLINIC_OR_DEPARTMENT_OTHER): Payer: Medicaid Other

## 2015-04-09 DIAGNOSIS — R110 Nausea: Secondary | ICD-10-CM | POA: Insufficient documentation

## 2015-04-09 DIAGNOSIS — R1084 Generalized abdominal pain: Secondary | ICD-10-CM | POA: Insufficient documentation

## 2015-04-09 DIAGNOSIS — J45909 Unspecified asthma, uncomplicated: Secondary | ICD-10-CM | POA: Insufficient documentation

## 2015-04-09 DIAGNOSIS — Z880 Allergy status to penicillin: Secondary | ICD-10-CM | POA: Insufficient documentation

## 2015-04-09 DIAGNOSIS — N939 Abnormal uterine and vaginal bleeding, unspecified: Secondary | ICD-10-CM | POA: Insufficient documentation

## 2015-04-09 DIAGNOSIS — Z79899 Other long term (current) drug therapy: Secondary | ICD-10-CM | POA: Insufficient documentation

## 2015-04-09 DIAGNOSIS — R1031 Right lower quadrant pain: Secondary | ICD-10-CM | POA: Insufficient documentation

## 2015-04-09 DIAGNOSIS — M549 Dorsalgia, unspecified: Secondary | ICD-10-CM | POA: Insufficient documentation

## 2015-04-09 MED ORDER — IOHEXOL 300 MG/ML  SOLN
100.0000 mL | Freq: Once | INTRAMUSCULAR | Status: AC | PRN
  Administered 2015-04-09: 100 mL via INTRAVENOUS

## 2015-04-09 MED ORDER — HYDROCODONE-ACETAMINOPHEN 5-325 MG PO TABS: 1.0000 | ORAL_TABLET | ORAL | Status: DC | PRN

## 2015-04-09 MED ORDER — PANTOPRAZOLE SODIUM 40 MG PO TBEC: 40.0000 mg | DELAYED_RELEASE_TABLET | Freq: Every day | ORAL | Status: DC

## 2015-04-09 MED ORDER — IOHEXOL 300 MG/ML  SOLN
25.0000 mL | Freq: Once | INTRAMUSCULAR | Status: AC | PRN
  Administered 2015-04-09: 25 mL via ORAL

## 2015-04-09 MED ORDER — ONDANSETRON HCL 4 MG/2ML IJ SOLN
4.0000 mg | Freq: Once | INTRAMUSCULAR | Status: AC
  Administered 2015-04-09: 4 mg via INTRAVENOUS
  Filled 2015-04-09: qty 2

## 2015-04-09 MED ORDER — SODIUM CHLORIDE 0.9 % IV BOLUS (SEPSIS)
1000.0000 mL | Freq: Once | INTRAVENOUS | Status: AC
  Administered 2015-04-09: 1000 mL via INTRAVENOUS

## 2015-04-09 MED ORDER — ONDANSETRON 4 MG PO TBDP: ORAL_TABLET | ORAL | Status: DC

## 2015-04-09 MED ORDER — MORPHINE SULFATE (PF) 4 MG/ML IV SOLN
4.0000 mg | Freq: Once | INTRAVENOUS | Status: AC
  Administered 2015-04-09: 4 mg via INTRAVENOUS
  Filled 2015-04-09: qty 1

## 2015-04-09 NOTE — ED Notes (Signed)
Pt resting quietly, appears comfortable, watching TV, family at bedside

## 2015-04-09 NOTE — ED Notes (Signed)
MD at bedside. 

## 2015-04-09 NOTE — Discharge Instructions (Signed)

## 2015-04-09 NOTE — ED Provider Notes (Signed)
CSN: [id number]     Arrival date & time 04/09/15  1045 History   First MD Initiated Contact with Patient 04/09/15 1113     Chief Complaint  Patient presents with  . Abdominal Pain     (Consider location/radiation/quality/duration/timing/severity/associated sxs/prior Treatment) HPI  24 year old female presents with abdominal pain for the past 1 week. Significantly worse this morning which is why she came to the ER. Patient describes the pain as diffuse and periumbilical. Has noticed some nausea after eating but at this point has not eaten in over 24 hours. Has had increased burping and pain as well. Has a history of acid reflux but does not think this is necessarily the same. Is having lower back pain as well. Denies any dysuria or vaginal discharge. Rates her pain as severe at this time.  Past Medical History  Diagnosis Date  . Asthma    Past Surgical History  Procedure Laterality Date  . Tonsillectomy     No family history on file. Social History  Substance Use Topics  . Smoking status: Never Smoker   . Smokeless tobacco: None  . Alcohol Use: Yes   OB History    Gravida Para Term Preterm AB TAB SAB Ectopic Multiple Living   1              Review of Systems  Constitutional: Negative for fever.  Gastrointestinal: Positive for nausea and abdominal pain. Negative for vomiting and diarrhea.  Genitourinary: Positive for vaginal bleeding (spotting). Negative for dysuria, hematuria and vaginal discharge.  Musculoskeletal: Positive for back pain.  All other systems reviewed and are negative.     Allergies  Penicillins and Doxycycline  Home Medications   Prior to Admission medications   Medication Sig Start Date End Date Taking? Authorizing Provider  acetaminophen (TYLENOL) 500 MG tablet Take 500 mg by mouth daily as needed for moderate pain.    Historical Provider, MD  albuterol (PROVENTIL HFA;VENTOLIN HFA) 108 (90 BASE) MCG/ACT inhaler Inhale 2 puffs into the lungs every 6  (six) hours as needed for wheezing or shortness of breath. 03/22/13   Clanford Cyndie Mull, MD  estradiol cypionate (DEPO-ESTRADIOL) 5 MG/ML injection Inject into the muscle every 28 (twenty-eight) days.    Historical Provider, MD   BP 118/72 mmHg  Pulse 78  Temp(Src) 98.4 F (36.9 C) (Oral)  Resp 16  Ht  (1.575 m)  Wt 122 lb (55.339 kg)  BMI 22.31 kg/m2  SpO2 100% Physical Exam  Constitutional: She is oriented to person, place, and time. She appears well-developed and well-nourished.  HENT:  Head: Normocephalic and atraumatic.  Right Ear: External ear normal.  Left Ear: External ear normal.  Nose: Nose normal.  Eyes: Right eye exhibits no discharge. Left eye exhibits no discharge.  Cardiovascular: Normal rate, regular rhythm and normal heart sounds.   Pulmonary/Chest: Effort normal and breath sounds normal.  Abdominal: Soft. There is tenderness in the right lower quadrant. There is no CVA tenderness.  Mild generalized tenderness that is worst in RLQ  Neurological: She is alert and oriented to person, place, and time.  Skin: Skin is warm and dry.  Nursing note and vitals reviewed.   ED Course  Procedures (including critical care time) Labs Review Labs Reviewed - No data to display Labs available in paper format due to lab downtime. No significant abnormalities.  Imaging Review Ct Abdomen Pelvis W Contrast  04/09/2015   CLINICAL DATA:  Abdominal pain, nausea for 1 week  EXAM: CT ABDOMEN  AND PELVIS WITH CONTRAST  TECHNIQUE: Multidetector CT imaging of the abdomen and pelvis was performed using the standard protocol following bolus administration of intravenous contrast.  CONTRAST:  OMNIPAQUE IOHEXOL 300 MG/ML SOLN, 25mL OMNIPAQUE IOHEXOL 300 MG/ML SOLN  COMPARISON:  None.  FINDINGS: The lung bases are unremarkable. Sagittal images of the spine are unremarkable.  Enhanced liver, pancreas, spleen and adrenal glands are unremarkable.  No aortic aneurysm. Kidneys are  symmetrical in size and enhancement. No hydronephrosis or hydroureter. There is moderate stool in right colon and transverse colon. No small bowel obstruction. No ascites or free air. No adenopathy.  There is no pericecal inflammation. Small free fluid noted within pelvis. The uterus and adnexa are unremarkable. Urinary bladder is unremarkable. Some stool noted in distal sigmoid colon and rectum. No distal colonic obstruction. No colitis or diverticulitis.  IMPRESSION: 1. No acute inflammatory process within abdomen or pelvis. 2. No hydronephrosis or hydroureter. 3. No small bowel obstruction. Moderate stool is noted in right colon and transverse colon. 4. Small free fluid noted within posterior pelvis. 5. No pericecal inflammation.  Appendix is not identified.   Electronically Signed   By: Natasha Mead M.D.   On: 04/09/2015 13:22   I have personally reviewed and evaluated these images and lab results as part of my medical decision-making.   EKG Interpretation None      MDM   Final diagnoses:  Generalized abdominal pain    No obvious etiology for patient's abdominal pain x 1 week. Given worsening with food and at night, likely stomach related. Will start on PPI. CT negative although appendix not visualized. Discussed this with patient but given normal WBC, length of symptoms and well appearance I doubt missed appendicitis. Recommended close f/u with PCP, urgent care or ER in 1-2 days if pain still present, sooner if worsening. Given slowly worsening symptoms I doubt torsion. No infectious symptoms to suggest TOA or cervicitis/PID.    Pricilla Loveless, MD 04/09/15 2023

## 2015-04-09 NOTE — ED Notes (Signed)
Mid abdominal pain x 2 days. Pain is worse after eating and causes her to burp.

## 2015-08-15 ENCOUNTER — Emergency Department (HOSPITAL_BASED_OUTPATIENT_CLINIC_OR_DEPARTMENT_OTHER)
Admission: EM | Admit: 2015-08-15 | Discharge: 2015-08-15 | Disposition: A | Discharge status: Home / Self Care | Payer: Medicaid Other | Attending: Emergency Medicine | Admitting: Emergency Medicine

## 2015-08-15 ENCOUNTER — Encounter (HOSPITAL_BASED_OUTPATIENT_CLINIC_OR_DEPARTMENT_OTHER): Payer: Self-pay

## 2015-08-15 DIAGNOSIS — N760 Acute vaginitis: Secondary | ICD-10-CM

## 2015-08-15 DIAGNOSIS — J45909 Unspecified asthma, uncomplicated: Secondary | ICD-10-CM | POA: Insufficient documentation

## 2015-08-15 DIAGNOSIS — Z880 Allergy status to penicillin: Secondary | ICD-10-CM | POA: Insufficient documentation

## 2015-08-15 DIAGNOSIS — Z3202 Encounter for pregnancy test, result negative: Secondary | ICD-10-CM | POA: Insufficient documentation

## 2015-08-15 LAB — URINALYSIS, ROUTINE W REFLEX MICROSCOPIC
BILIRUBIN URINE: NEGATIVE
GLUCOSE, UA: NEGATIVE mg/dL
KETONES UR: 15 mg/dL — AB
Leukocytes, UA: NEGATIVE
Nitrite: NEGATIVE
PROTEIN: NEGATIVE mg/dL
Specific Gravity, Urine: 1.029 (ref 1.005–1.030)
pH: 6.5 (ref 5.0–8.0)

## 2015-08-15 LAB — WET PREP, GENITAL
CLUE CELLS WET PREP: NONE SEEN
SPERM: NONE SEEN
Trich, Wet Prep: NONE SEEN
YEAST WET PREP: NONE SEEN

## 2015-08-15 LAB — PREGNANCY, URINE: Preg Test, Ur: NEGATIVE

## 2015-08-15 LAB — URINE MICROSCOPIC-ADD ON

## 2015-08-15 MED ORDER — ONDANSETRON 4 MG PO TBDP
4.0000 mg | ORAL_TABLET | Freq: Once | ORAL | Status: AC
  Administered 2015-08-15: 4 mg via ORAL
  Filled 2015-08-15: qty 1

## 2015-08-15 MED ORDER — AZITHROMYCIN 250 MG PO TABS
1000.0000 mg | ORAL_TABLET | Freq: Once | ORAL | Status: AC
  Administered 2015-08-15: 1000 mg via ORAL
  Filled 2015-08-15: qty 4

## 2015-08-15 NOTE — ED Notes (Signed)
Vaginal d/c x today

## 2015-08-15 NOTE — Discharge Instructions (Signed)
Vaginitis Vaginitis is an inflammation of the vagina. It is most often caused by a change in the normal balance of the bacteria and yeast that live in the vagina. This change in balance causes an overgrowth of certain bacteria or yeast, which causes the inflammation. There are different types of vaginitis, but the most common types are:  Bacterial vaginosis.  Yeast infection (candidiasis).  Trichomoniasis vaginitis. This is a sexually transmitted infection (STI).  Viral vaginitis.  Atrophic vaginitis.  Allergic vaginitis. CAUSES  The cause depends on the type of vaginitis. Vaginitis can be caused by:  Bacteria (bacterial vaginosis).  Yeast (yeast infection).  A parasite (trichomoniasis vaginitis)  A virus (viral vaginitis).  Low hormone levels (atrophic vaginitis). Low hormone levels can occur during pregnancy, breastfeeding, or after menopause.  Irritants, such as bubble baths, scented tampons, and feminine sprays (allergic vaginitis). Other factors can change the normal balance of the yeast and bacteria that live in the vagina. These include:  Antibiotic medicines.  Poor hygiene.  Diaphragms, vaginal sponges, spermicides, birth control pills, and intrauterine devices (IUD).  Sexual intercourse.  Infection.  Uncontrolled diabetes.  A weakened immune system. SYMPTOMS  Symptoms can vary depending on the cause of the vaginitis. Common symptoms include:  Abnormal vaginal discharge.  The discharge is white, gray, or yellow with bacterial vaginosis.  The discharge is thick, white, and cheesy with a yeast infection.  The discharge is frothy and yellow or greenish with trichomoniasis.  A bad vaginal odor.  The odor is fishy with bacterial vaginosis.  Vaginal itching, pain, or swelling.  Painful intercourse.  Pain or burning when urinating. Sometimes, there are no symptoms. TREATMENT  Treatment will vary depending on the type of infection.   Bacterial  vaginosis and trichomoniasis are often treated with antibiotic creams or pills.  Yeast infections are often treated with antifungal medicines, such as vaginal creams or suppositories.  Viral vaginitis has no cure, but symptoms can be treated with medicines that relieve discomfort. Your sexual partner should be treated as well.  Atrophic vaginitis may be treated with an estrogen cream, pill, suppository, or vaginal ring. If vaginal dryness occurs, lubricants and moisturizing creams may help. You may be told to avoid scented soaps, sprays, or douches.  Allergic vaginitis treatment involves quitting the use of the product that is causing the problem. Vaginal creams can be used to treat the symptoms. HOME CARE INSTRUCTIONS   Take all medicines as directed by your caregiver.  Keep your genital area clean and dry. Avoid soap and only rinse the area with water.  Avoid douching. It can remove the healthy bacteria in the vagina.  Do not use tampons or have sexual intercourse until your vaginitis has been treated. Use sanitary pads while you have vaginitis.  Wipe from front to back. This avoids the spread of bacteria from the rectum to the vagina.  Let air reach your genital area.  Wear cotton underwear to decrease moisture buildup.  Avoid wearing underwear while you sleep until your vaginitis is gone.  Avoid tight pants and underwear or nylons without a cotton panel.  Take off wet clothing (especially bathing suits) as soon as possible.  Use mild, non-scented products. Avoid using irritants, such as:  Scented feminine sprays.  Fabric softeners.  Scented detergents.  Scented tampons.  Scented soaps or bubble baths.  Practice safe sex and use condoms. Condoms may prevent the spread of trichomoniasis and viral vaginitis. SEEK MEDICAL CARE IF:   You have abdominal pain.  You  have a fever or persistent symptoms for more than 2-3 days.  You have a fever and your symptoms suddenly  get worse.   This information is not intended to replace advice given to you by your health care provider. Make sure you discuss any questions you have with your health care provider.   Document Released: 06/07/2007 Document Revised: 12/25/2014 Document Reviewed: 01/21/2012 Elsevier Interactive Patient Education 2016 [employer]. Safe Sex Safe sex is about reducing the risk of giving or getting a sexually transmitted disease (STD). STDs are spread through sexual contact involving the genitals, mouth, or rectum. Some STDs can be cured and others cannot. Safe sex can also prevent unintended pregnancies.  WHAT ARE SOME SAFE SEX PRACTICES?  Limit your sexual activity to only one partner who is having sex with only you.  Talk to your partner about his or her past partners, past STDs, and drug use.  Use a condom every time you have sexual intercourse. This includes vaginal, oral, and anal sexual activity. Both females and males should wear condoms during oral sex. Only use latex or polyurethane condoms and water-based lubricants. Using petroleum-based lubricants or oils to lubricate a condom will weaken the condom and increase the chance that it will break. The condom should be in place from the beginning to the end of sexual activity. Wearing a condom reduces, but does not completely eliminate, your risk of getting or giving an STD. STDs can be spread by contact with infected body fluids and skin.  Get vaccinated for hepatitis B and HPV.  Avoid alcohol and recreational drugs, which can affect your judgment. You may forget to use a condom or participate in high-risk sex.  For females, avoid douching after sexual intercourse. Douching can spread an infection farther into the reproductive tract.  Check your body for signs of sores, blisters, rashes, or unusual discharge. See your health care provider if you notice any of these signs.  Avoid sexual contact if you have symptoms of an infection or are  being treated for an STD. If you or your partner has herpes, avoid sexual contact when blisters are present. Use condoms at all other times.  If you are at risk of being infected with HIV, it is recommended that you take a prescription medicine daily to prevent HIV infection. This is called pre-exposure prophylaxis (PrEP). You are considered at risk if:  You are a man who has sex with other men (MSM).  You are a heterosexual man or woman who is sexually active with more than one partner.  You take drugs by injection.  You are sexually active with a partner who has HIV.  Talk with your health care provider about whether you are at high risk of being infected with HIV. If you choose to begin PrEP, you should first be tested for HIV. You should then be tested every 3 months for as long as you are taking PrEP.  See your health care provider for regular screenings, exams, and tests for other STDs. Before having sex with a new partner, each of you should be screened for STDs and should talk about the results with each other. WHAT ARE THE BENEFITS OF SAFE SEX?   There is less chance of getting or giving an STD.  You can prevent unwanted or unintended pregnancies.  By discussing safe sex concerns with your partner, you may increase feelings of intimacy, comfort, trust, and honesty between the two of you.   This information is not intended to  replace advice given to you by your health care provider. Make sure you discuss any questions you have with your health care provider.   Document Released: 09/17/2004 Document Revised: 08/31/2014 Document Reviewed: 02/01/2012 Elsevier Interactive Patient Education [employer]2016 Elsevier Inc[employer]

## 2015-08-15 NOTE — ED Provider Notes (Signed)
CSN: [id number]     Arrival date & time 08/15/15  1610 History   First MD Initiated Contact with Patient 08/15/15 1722     Chief Complaint  Patient presents with  . Vaginal Discharge     (Consider location/radiation/quality/duration/timing/severity/associated sxs/prior Treatment) HPI   Patient visits to the emergency department for evaluation of vaginal discharge. She reports 2 weeks ago that she switched to a new condom that did not have any lubricant. She is concerned with possible tear. Since then she has been having burning with dyspareunia. She has not been having any nausea, vomiting, diarrhea, back pain, headache, confusion. She reports the discharge has been dark brown.   Past Medical History  Diagnosis Date  . Asthma    Past Surgical History  Procedure Laterality Date  . Tonsillectomy     No family history on file. Social History  Substance Use Topics  . Smoking status: Never Smoker   . Smokeless tobacco: None  . Alcohol Use: Yes   OB History    Gravida Para Term Preterm AB TAB SAB Ectopic Multiple Living   1              Review of Systems  Review of Systems All other systems negative except as documented in the HPI. All pertinent positives and negatives as reviewed in the HPI.   Allergies  Penicillins and Doxycycline  Home Medications   Prior to Admission medications   Not on File   BP 133/87 mmHg  Pulse 71  Temp(Src) 98.7 F (37.1 C) (Oral)  Resp 16  Ht 5\' 2"  (1.575 m)  Wt 58.968 kg  BMI 23.77 kg/m2  SpO2 100% Physical Exam  Constitutional: She appears well-developed and well-nourished. No distress.  HENT:  Head: Normocephalic and atraumatic.  Right Ear: Tympanic membrane and ear canal normal.  Left Ear: Tympanic membrane and ear canal normal.  Nose: Nose normal.  Mouth/Throat: Uvula is midline, oropharynx is clear and moist and mucous membranes are normal.  Eyes: Pupils are equal, round, and reactive to light.  Neck: Normal range of  motion. Neck supple.  Cardiovascular: Normal rate and regular rhythm.   Pulmonary/Chest: Effort normal.  Abdominal: Soft.  No signs of abdominal distention  Genitourinary: Uterus normal. There is no rash or tenderness on the right labia. There is no rash or tenderness on the left labia. Cervix exhibits no motion tenderness, no discharge and no friability. Right adnexum displays no mass and no tenderness. Left adnexum displays no mass and no tenderness. No erythema, tenderness or bleeding in the vagina. No foreign body around the vagina. No signs of injury around the vagina. Vaginal discharge (dark brown) found.  Musculoskeletal:  No LE swelling  Neurological: She is alert.  Acting at baseline  Skin: Skin is warm and dry. No rash noted.  Nursing note and vitals reviewed.   ED Course  Procedures (including critical care time) Labs Review Labs Reviewed  WET PREP, GENITAL - Abnormal; Notable for the following:    WBC, Wet Prep HPF POC MODERATE (*)    All other components within normal limits  URINALYSIS, ROUTINE W REFLEX MICROSCOPIC (NOT AT [facility]) - Abnormal; Notable for the following:    Hgb urine dipstick MODERATE (*)    Ketones, ur 15 (*)    All other components within normal limits  URINE MICROSCOPIC-ADD ON - Abnormal; Notable for the following:    Squamous Epithelial / LPF 0-5 (*)    Bacteria, UA RARE (*)    All  other components within normal limits  URINE CULTURE  PREGNANCY, URINE  GC/CHLAMYDIA PROBE AMP ([place]) NOT AT [facility]    Imaging Review No results found. I have personally reviewed and evaluated these images and lab results as part of my medical decision-making.   EKG Interpretation None      MDM   Final diagnoses:  Vaginitis    Patient to the ER with vaginal complaints and discharge. No CMT, wet prep shows moderate WBC She was treated with due to medication allergies.:  Medications  azithromycin (ZITHROMAX) tablet 1,000 mg (1,000 mg Oral Given 08/15/15  1953)  ondansetron (ZOFRAN-ODT) disintegrating tablet 4 mg (4 mg Oral Given 08/15/15 1953)   Referral to [facility] for follow-up, GC are pending. Non tender abdomen and afebrile.   I feel the patient has had an appropriate workup for their chief complaint at this time and likelihood of emergent condition existing is low. Discussed s/sx that warrant return to the ED.  Filed Vitals:   08/15/15 1814 08/15/15 1944  BP: 117/64 133/87  Pulse: 66 71  Temp:    Resp: 18 [address] PA-C 08/20/15 1478  Rolland Porter, MD 08/31/15 [phone]

## 2015-08-16 LAB — GC/CHLAMYDIA PROBE AMP (~~LOC~~) NOT AT ARMC
Chlamydia: NEGATIVE
NEISSERIA GONORRHEA: NEGATIVE

## 2015-08-17 LAB — URINE CULTURE: Special Requests: NORMAL

## 2015-12-23 ENCOUNTER — Encounter (HOSPITAL_BASED_OUTPATIENT_CLINIC_OR_DEPARTMENT_OTHER): Payer: Self-pay | Admitting: *Deleted

## 2015-12-23 ENCOUNTER — Emergency Department (HOSPITAL_BASED_OUTPATIENT_CLINIC_OR_DEPARTMENT_OTHER)
Admission: EM | Admit: 2015-12-23 | Discharge: 2015-12-23 | Disposition: A | Discharge status: Home / Self Care | Payer: Medicaid Other | Attending: Emergency Medicine | Admitting: Emergency Medicine

## 2015-12-23 DIAGNOSIS — Y999 Unspecified external cause status: Secondary | ICD-10-CM | POA: Insufficient documentation

## 2015-12-23 DIAGNOSIS — W57XXXA Bitten or stung by nonvenomous insect and other nonvenomous arthropods, initial encounter: Secondary | ICD-10-CM | POA: Insufficient documentation

## 2015-12-23 DIAGNOSIS — S90465A Insect bite (nonvenomous), left lesser toe(s), initial encounter: Secondary | ICD-10-CM | POA: Diagnosis not present

## 2015-12-23 DIAGNOSIS — J45909 Unspecified asthma, uncomplicated: Secondary | ICD-10-CM | POA: Insufficient documentation

## 2015-12-23 DIAGNOSIS — M79672 Pain in left foot: Secondary | ICD-10-CM | POA: Diagnosis present

## 2015-12-23 DIAGNOSIS — Y939 Activity, unspecified: Secondary | ICD-10-CM | POA: Diagnosis not present

## 2015-12-23 DIAGNOSIS — Y92832 Beach as the place of occurrence of the external cause: Secondary | ICD-10-CM | POA: Insufficient documentation

## 2015-12-23 MED ORDER — HYDROCORTISONE 1 % EX CREA: TOPICAL_CREAM | CUTANEOUS | Status: DC

## 2015-12-23 NOTE — ED Notes (Addendum)
Pt c/o left foot pain and redness x 2 days denies injury

## 2015-12-23 NOTE — ED Provider Notes (Signed)
CSN: [id number]     Arrival date & time 12/23/15  1835 History   First MD Initiated Contact with Patient 12/23/15 1943     Chief Complaint  Patient presents with  . Foot Pain     (Consider location/radiation/quality/duration/timing/severity/associated sxs/prior Treatment) HPI Comments: Patient presents to the emergency department with chief complaint of irritation to left second toe. She states that she was at the beach 2 [place] ago. States that she noticed a small blister that appeared today. She states that it is very itchy. She states that she popped the very small blister, and clear fluid came out. She denies pain, clarifying that it is only itchy. She denies any difficulty moving her toes or ankle. Denies any fevers or chills. There are no modifying factors. She has not tried treating with anything.  The history is provided by the patient. No language interpreter was used.    Past Medical History  Diagnosis Date  . Asthma    Past Surgical History  Procedure Laterality Date  . Tonsillectomy     History reviewed. No pertinent family history. Social History  Substance Use Topics  . Smoking status: Never Smoker   . Smokeless tobacco: None  . Alcohol Use: Yes   OB History    Gravida Para Term Preterm AB TAB SAB Ectopic Multiple Living   1              Review of Systems  Constitutional: Negative for fever and chills.  Respiratory: Negative for shortness of breath.   Cardiovascular: Negative for chest pain.  Gastrointestinal: Negative for nausea, vomiting, diarrhea and constipation.  Genitourinary: Negative for dysuria.  Skin: Positive for rash.      Allergies  Penicillins and Doxycycline  Home Medications   Prior to Admission medications   Not on File   BP 149/83 mmHg  Pulse 73  Temp(Src) 98.2 F (36.8 C) (Oral)  Resp 16  Ht 5\' 2"  (1.575 m)  Wt 58.514 kg  BMI 23.59 kg/m2  SpO2 100% Physical Exam  Constitutional: She is oriented to person, place, and time.  She appears well-developed and well-nourished.  HENT:  Head: Normocephalic and atraumatic.  Eyes: Conjunctivae and EOM are normal.  Neck: Normal range of motion.  Cardiovascular: Normal rate.   Pulmonary/Chest: Effort normal.  Abdominal: She exhibits no distension.  Musculoskeletal: Normal range of motion.  Neurological: She is alert and oriented to person, place, and time.  Skin: Skin is dry.  Mild erythema about the left second toe, no evidence of abscess  Psychiatric: She has a normal mood and affect. Her behavior is normal. Judgment and thought content normal.  Nursing note and vitals reviewed.   ED Course  Procedures (including critical care time)   MDM   Final diagnoses:  Insect bite    Patient was small area of irritation to left second toe, patient may been bitten by something, maybe a sand flea.  Does not look like cellulitis, there is no abscess. She reports that it is very itchy. Additionally, does not appear to be tinea.  Will give hydrocortisone cream and recommend that she watch it closely for worsening symptoms.  She understands and agrees with the plan.    Roxy Horsemanobert Cherica Heiden, PA-C 12/23/15 2005  Vanetta MuldersScott Zackowski, MD 12/30/15 1118

## 2015-12-23 NOTE — Discharge Instructions (Signed)
Insect Bite Mosquitoes, flies, fleas, bedbugs, and many other insects can bite. Insect bites are different from insect stings. A sting is when poison (venom) is injected into the skin. Insect bites can cause pain or itching for a few days, but they are usually not serious. Some insects can spread diseases to people through a bite. SYMPTOMS  Symptoms of an insect bite include:  Itching or pain in the bite area.  Redness and swelling in the bite area.  An open wound (skin ulcer). In many cases, symptoms last for 2-4 days.  DIAGNOSIS  This condition is usually diagnosed based on symptoms and a physical exam. TREATMENT  Treatment is usually not needed for an insect bite. Symptoms often go away on their own. Your health care provider may recommend creams or lotions to help reduce itching. Antibiotic medicines may be prescribed if the bite becomes infected. A tetanus shot may be given in some cases. If you develop an allergic reaction to an insect bite, your health care provider will prescribe medicines to treat the reaction (antihistamines). This is rare. HOME CARE INSTRUCTIONS  Do not scratch the bite area.  Keep the bite area clean and dry. Wash the bite area daily with soap and water as told by your health care provider.  If directed, applyice to the bite area.  Put ice in a plastic bag.  Place a towel between your skin and the bag.  Leave the ice on for 20 minutes, 2-3 times per day.  To help reduce itching and swelling, try applying a baking soda paste, cortisone cream, or calamine lotion to the bite area as told by your health care provider.  Apply or take over-the-counter and prescription medicines only as told by your health care provider.  If you were prescribed an antibiotic medicine, use it as told by your health care provider. Do not stop using the antibiotic even if your condition improves.  Keep all follow-up visits as told by your health care provider. This is  important. PREVENTION   Use insect repellent. The best insect repellents contain:  DEET, picaridin, oil of lemon eucalyptus (OLE), or IR3535.  Higher amounts of an active ingredient.  When you are outdoors, wear clothing that covers your arms and legs.  Avoid opening windows that do not have window screens. SEEK MEDICAL CARE IF:  You have increased redness, swelling, or pain in the bite area.  You have a fever. SEEK IMMEDIATE MEDICAL CARE IF:   You have joint pain.   You have fluid, blood, or pus coming from the bite area.  You have a headache or neck pain.  You have unusual weakness.  You have a rash.  You have chest pain or shortness of breath.  You have abdominal pain, nausea, or vomiting.  You feel unusually tired or sleepy.   This information is not intended to replace advice given to you by your health care provider. Make sure you discuss any questions you have with your health care provider.   Document Released: 09/17/2004 Document Revised: 05/01/2015 Document Reviewed: 12/26/2014 Elsevier Interactive Patient Education 2016 [employer]  

## 2016-07-26 ENCOUNTER — Emergency Department (HOSPITAL_BASED_OUTPATIENT_CLINIC_OR_DEPARTMENT_OTHER)
Admission: EM | Admit: 2016-07-26 | Discharge: 2016-07-26 | Discharge status: Against Medical Advice | Payer: Medicaid Other | Attending: Emergency Medicine | Admitting: Emergency Medicine

## 2016-07-26 ENCOUNTER — Encounter (HOSPITAL_BASED_OUTPATIENT_CLINIC_OR_DEPARTMENT_OTHER): Payer: Self-pay | Admitting: *Deleted

## 2016-07-26 DIAGNOSIS — R05 Cough: Secondary | ICD-10-CM | POA: Insufficient documentation

## 2016-07-26 DIAGNOSIS — R1030 Lower abdominal pain, unspecified: Secondary | ICD-10-CM

## 2016-07-26 DIAGNOSIS — J3489 Other specified disorders of nose and nasal sinuses: Secondary | ICD-10-CM | POA: Insufficient documentation

## 2016-07-26 DIAGNOSIS — R0981 Nasal congestion: Secondary | ICD-10-CM | POA: Insufficient documentation

## 2016-07-26 DIAGNOSIS — J45909 Unspecified asthma, uncomplicated: Secondary | ICD-10-CM | POA: Insufficient documentation

## 2016-07-26 DIAGNOSIS — N898 Other specified noninflammatory disorders of vagina: Secondary | ICD-10-CM

## 2016-07-26 LAB — URINALYSIS, ROUTINE W REFLEX MICROSCOPIC
Bilirubin Urine: NEGATIVE
GLUCOSE, UA: NEGATIVE mg/dL
HGB URINE DIPSTICK: NEGATIVE
Ketones, ur: NEGATIVE mg/dL
LEUKOCYTES UA: NEGATIVE
Nitrite: NEGATIVE
PH: 5.5 (ref 5.0–8.0)
Protein, ur: NEGATIVE mg/dL
SPECIFIC GRAVITY, URINE: 1.023 (ref 1.005–1.030)

## 2016-07-26 LAB — PREGNANCY, URINE: Preg Test, Ur: NEGATIVE

## 2016-07-26 LAB — WET PREP, GENITAL
CLUE CELLS WET PREP: NONE SEEN
SPERM: NONE SEEN
TRICH WET PREP: NONE SEEN
Yeast Wet Prep HPF POC: NONE SEEN

## 2016-07-26 NOTE — ED Notes (Signed)
Patient denies pain and is resting comfortably.  

## 2016-07-26 NOTE — ED Notes (Signed)
Informed nurse and PA that she had to leave and we could call her if needed. Left the exam room before nurse could get best contact number or go over printed discharge instructions.

## 2016-07-26 NOTE — Discharge Instructions (Signed)
Read the information below.  You had to leave prior to the full assessment. You will be notified if any abnormal results and treated. Please follow up with your OBGYN next week for re-evaluation.  You can take tylenol or motrin for pain relief.  You may return to the Emergency Department at any time for worsening condition or any new symptoms that concern you. Return to ED if you develop fever, constant abdominal pain, nausea/vomiting, unable to keep food/fluid, or any other new/concerning symptoms.

## 2016-07-26 NOTE — ED Triage Notes (Signed)
Patient is alert and oriented x4.  She is complaining of abdominal pain that started on Monday.  Patient has a Hx of yeast infections and states that this feels similar to past incidences. Currently she rates her pain 6 of 10.

## 2016-07-26 NOTE — ED Provider Notes (Signed)
[facility]-EMERGENCY DEPT Provider Note   CSN: [id number] Arrival date & time: 07/26/16  1801  By signing my name below, I, Linna DarnerRussell Turner, attest that this documentation has been prepared under the direction and in the presence of Arvilla MeresAshley Meyer, PA-C. Electronically Signed: Linna Darnerussell Turner, Scribe. 07/26/2016. 6:56 PM.  History   Chief Complaint Chief Complaint  Patient presents with  . Abdominal Pain    The history is provided by the patient. No language interpreter was used.     HPI Comments: Katherine Dominguez is a 25 y.o. female who presents to the Emergency Department complaining of thicken white vaginal discharge and intermittent, cramping, lower abdominal pain beginning 6 days ago. She states she experiences the exact same abdominal pain when she has bacterial or yeast infection and notes her current pain is exactly the same. She recently had an Implanon device placed and notes she has had some thick, white vaginal discharge as well as some vaginal spotting since. Pt has tried Tylenol for her abdominal cramps with some relief of the pain. She reports she has been sexually active within the last 6 months with one female partner and uses barrier protection most of the time. She has no concern for STD. She reports recent cough, rhinorrhea, and congestion and believes this is from her daughter who has similar symptoms. She denies nausea, vomiting, fever, chills, dysuria, hematuria, flank pain, back pain, rash, or any other associated symptoms.  Past Medical History:  Diagnosis Date  . Asthma     Patient Active Problem List   Diagnosis Date Noted  . Asthma with acute exacerbation 03/22/2013  . 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 Allergic rhinitis 03/22/2013    Past Surgical History:  Procedure Laterality Date  . TONSILLECTOMY      OB History    Gravida Para Term Preterm AB Living   1             SAB TAB Ectopic Multiple Live Births                   Home Medications    Prior to  Admission medications   Medication Sig Start Date End Date Taking? Authorizing Provider  hydrocortisone cream 1 % Apply to affected area 2 times daily 12/23/15   Roxy Horsemanobert Browning, PA-C    Family History History reviewed. No pertinent family history.  Social History Social History  Substance Use Topics  . Smoking status: Never Smoker  . Smokeless tobacco: Never Used  . Alcohol use Yes     Allergies   Penicillins and Doxycycline   Review of Systems Review of Systems  Constitutional: Negative for chills and fever.  HENT: Positive for congestion and rhinorrhea.   Eyes: Negative for visual disturbance.  Respiratory: Positive for cough. Negative for shortness of breath.   Cardiovascular: Negative for chest pain.  Gastrointestinal: Positive for abdominal pain ( intermittent, cramping). Negative for nausea and vomiting.  Genitourinary: Positive for vaginal bleeding (spotting secondary to recent implanon) and vaginal discharge. Negative for dysuria, flank pain, hematuria and vaginal pain.  Musculoskeletal: Negative for back pain.  Skin: Negative for rash.  Neurological: Negative for syncope.     Physical Exam Updated Vital Signs BP 135/89 (BP Location: Left Arm)   Pulse 85   Temp 98.1 F (36.7 C) (Oral)   Resp 16   Ht 5\' 2"  (1.575 m)   Wt 57.2 kg   SpO2 98%   BMI 23.05 kg/m   Physical Exam  Constitutional: She appears well-developed  and well-nourished. No distress.  HENT:  Head: Normocephalic and atraumatic.  Mouth/Throat: Oropharynx is clear and moist. No oropharyngeal exudate.  Eyes: Conjunctivae and EOM are normal. Pupils are equal, round, and reactive to light. Right eye exhibits no discharge. Left eye exhibits no discharge. No scleral icterus.  Neck: Normal range of motion and phonation normal. Neck supple. No neck rigidity. Normal range of motion present.  Cardiovascular: Normal rate, regular rhythm, normal heart sounds and intact distal pulses.   No murmur  heard. Pulmonary/Chest: Effort normal and breath sounds normal. No stridor. No respiratory distress. She has no wheezes. She has no rales.  Abdominal: Soft. Bowel sounds are normal. She exhibits no distension. There is no tenderness. There is no rigidity, no rebound, no guarding and no CVA tenderness.  No TTP of abdomen. Soft. +BS.   Genitourinary: Vagina normal. Pelvic exam was performed with patient supine. There is no rash, tenderness or lesion on the right labia. There is no rash, tenderness or lesion on the left labia. Cervix exhibits discharge ( bloody). Cervix exhibits no motion tenderness. Right adnexum displays no mass and no tenderness. Left adnexum displays no mass and no tenderness.  Genitourinary Comments: Chaperone present for duration of exam. External anatomy normal - no injury, lesions, masses, or rashes. No bleeding, lesions, masses, or ulcerations in vaginal cavity. Cervix is closed with bloody discharge. No friability. No CMT, no adnexal tenderness, no masses palpated on bimanual exam.   Musculoskeletal: Normal range of motion.  Lymphadenopathy:    She has no cervical adenopathy.  Neurological: She is alert. She is not disoriented. Coordination and gait normal. GCS eye subscore is 4. GCS verbal subscore is 5. GCS motor subscore is 6.  Skin: Skin is warm and dry. She is not diaphoretic.  Psychiatric: She has a normal mood and affect. Her behavior is normal.     ED Treatments / Results  Labs (all labs ordered are listed, but only abnormal results are displayed) Labs Reviewed  WET PREP, GENITAL - Abnormal; Notable for the following:       Result Value   WBC, Wet Prep HPF POC FEW (*)    All other components within normal limits  URINALYSIS, ROUTINE W REFLEX MICROSCOPIC (NOT AT [facility])  PREGNANCY, URINE  GC/CHLAMYDIA PROBE AMP ([place]) NOT AT [facility]    EKG  EKG Interpretation None       Radiology No results found.  Procedures Procedures (including critical care  time)  DIAGNOSTIC STUDIES: Oxygen Saturation is 98% on RA, normal by my interpretation.    COORDINATION OF CARE: 7:09 PM Discussed treatment plan with pt at bedside and pt agreed to plan.  Medications Ordered in ED Medications - No data to display   Initial Impression / Assessment and Plan / ED Course  I have reviewed the triage vital signs and the nursing notes.  Pertinent labs & imaging results that were available during my care of the patient were reviewed by me and considered in my medical decision making (see chart for details).  Clinical Course     Patient presents to ED with complaint of white vaginal discharge and intermittent abdominal cramping. Patient is afebrile and non-toxic appearing in NAD. VSS. Benign abdominal exam. No CVA tenderness. Pelvic exam remarkable for bloody discharge. No CMT. No adnexal tenderness. Low suspicion for torsion or PID at this time.   Patient stated she had to leave to pick her kids up prior to complete evaluation. Discussed with patient would call if abnormal  results. Encouraged follow up with PCP or OBGYN. Return precautions given. Pt eloped.   Urine pregnancy negative - doubt ectopic. U/A negative for UTI. Few WBC seen on wet prep. No evidence of BV, yeast, or trichomoniasis. GC/chlamydia pending. ?sxs secondary to recent implanon placement.   I personally performed the services described in this documentation, which was scribed in my presence. The recorded information has been reviewed and is accurate.    Final Clinical Impressions(s) / ED Diagnoses   Final diagnoses:  Vaginal discharge  Lower abdominal pain    New Prescriptions Discharge Medication List as of 07/26/2016  9:00 PM       Lona KettleAshley Laurel Meyer, PA-C 07/28/16 0414    Nira ConnPedro Eduardo Cardama, MD 07/30/16 1151

## 2016-07-26 NOTE — ED Notes (Signed)
States has had a white vag d/c x 1 week with itching, denies abd pain or urinary s/s

## 2016-07-28 LAB — GC/CHLAMYDIA PROBE AMP (~~LOC~~) NOT AT ARMC
CHLAMYDIA, DNA PROBE: NEGATIVE
NEISSERIA GONORRHEA: NEGATIVE

## 2019-04-18 ENCOUNTER — Encounter (HOSPITAL_BASED_OUTPATIENT_CLINIC_OR_DEPARTMENT_OTHER): Payer: Self-pay | Admitting: *Deleted

## 2019-04-18 ENCOUNTER — Other Ambulatory Visit: Payer: Self-pay

## 2019-04-18 ENCOUNTER — Emergency Department (HOSPITAL_BASED_OUTPATIENT_CLINIC_OR_DEPARTMENT_OTHER)
Admission: EM | Admit: 2019-04-18 | Discharge: 2019-04-19 | Disposition: A | Discharge status: Home / Self Care | Payer: Medicaid Other | Attending: Emergency Medicine | Admitting: Emergency Medicine

## 2019-04-18 DIAGNOSIS — Z3A01 Less than 8 weeks gestation of pregnancy: Secondary | ICD-10-CM | POA: Insufficient documentation

## 2019-04-18 DIAGNOSIS — R112 Nausea with vomiting, unspecified: Secondary | ICD-10-CM

## 2019-04-18 DIAGNOSIS — O219 Vomiting of pregnancy, unspecified: Secondary | ICD-10-CM | POA: Diagnosis present

## 2019-04-18 LAB — CBC WITH DIFFERENTIAL/PLATELET
Abs Immature Granulocytes: 0.02 10*3/uL (ref 0.00–0.07)
Basophils Absolute: 0.1 10*3/uL (ref 0.0–0.1)
Basophils Relative: 1 %
Eosinophils Absolute: 0.1 10*3/uL (ref 0.0–0.5)
Eosinophils Relative: 1 %
HCT: 39.6 % (ref 36.0–46.0)
Hemoglobin: 13.1 g/dL (ref 12.0–15.0)
Immature Granulocytes: 0 %
Lymphocytes Relative: 36 %
Lymphs Abs: 2.4 10*3/uL (ref 0.7–4.0)
MCH: 27.4 pg (ref 26.0–34.0)
MCHC: 33.1 g/dL (ref 30.0–36.0)
MCV: 82.8 fL (ref 80.0–100.0)
Monocytes Absolute: 0.5 10*3/uL (ref 0.1–1.0)
Monocytes Relative: 8 %
Neutro Abs: 3.6 10*3/uL (ref 1.7–7.7)
Neutrophils Relative %: 54 %
Platelets: 338 10*3/uL (ref 150–400)
RBC: 4.78 MIL/uL (ref 3.87–5.11)
RDW: 12.9 % (ref 11.5–15.5)
WBC: 6.7 10*3/uL (ref 4.0–10.5)
nRBC: 0 % (ref 0.0–0.2)

## 2019-04-18 LAB — URINALYSIS, ROUTINE W REFLEX MICROSCOPIC
Bilirubin Urine: NEGATIVE
Glucose, UA: NEGATIVE mg/dL
Hgb urine dipstick: NEGATIVE
Ketones, ur: 15 mg/dL — AB
Leukocytes,Ua: NEGATIVE
Nitrite: NEGATIVE
Protein, ur: NEGATIVE mg/dL
Specific Gravity, Urine: 1.025 (ref 1.005–1.030)
pH: 6 (ref 5.0–8.0)

## 2019-04-18 LAB — COMPREHENSIVE METABOLIC PANEL
ALT: 14 U/L (ref 0–44)
AST: 17 U/L (ref 15–41)
Albumin: 4.3 g/dL (ref 3.5–5.0)
Alkaline Phosphatase: 61 U/L (ref 38–126)
Anion gap: 11 (ref 5–15)
BUN: 11 mg/dL (ref 6–20)
CO2: 24 mmol/L (ref 22–32)
Calcium: 9.2 mg/dL (ref 8.9–10.3)
Chloride: 103 mmol/L (ref 98–111)
Creatinine, Ser: 0.57 mg/dL (ref 0.44–1.00)
GFR calc Af Amer: >60 mL/min (ref >60)
GFR calc non Af Amer: >60 mL/min (ref >60)
Glucose, Bld: 95 mg/dL (ref 70–99)
Potassium: 3.5 mmol/L (ref 3.5–5.1)
Sodium: 138 mmol/L (ref 135–145)
Total Bilirubin: 0.5 mg/dL (ref 0.3–1.2)
Total Protein: 7.5 g/dL (ref 6.5–8.1)

## 2019-04-18 LAB — PREGNANCY, URINE: Preg Test, Ur: POSITIVE — AB

## 2019-04-18 LAB — LIPASE, BLOOD: Lipase: 27 U/L (ref 11–51)

## 2019-04-18 MED ORDER — SODIUM CHLORIDE 0.9 % IV BOLUS
1000.0000 mL | Freq: Once | INTRAVENOUS | Status: AC
  Administered 2019-04-18: 1000 mL via INTRAVENOUS

## 2019-04-18 NOTE — ED Notes (Signed)
Last day of last  period was August 1st, 2020.  Pt. Reports no birth control used

## 2019-04-18 NOTE — ED Provider Notes (Signed)
[facility] DEPART[facility] Provider Note   CSN: [id number] Arrival date & time: 04/18/19  2114     History   Chief Complaint Chief Complaint  Patient presents with   Emesis   Amenorrhea    HPI Katherine Dominguez is a 28 y.o. female.     The history is provided by the patient and medical records. No language interpreter was used.  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 Authorizing Provider  hydrocortisone cream 1 % Apply to affected area 2 times daily 12/23/15   Montine Circle, PA-C    Family History No family history on file.  Social History Social History   Tobacco Use   Smoking status: Never Smoker   Smokeless tobacco: Never Used  Substance Use Topics   Alcohol use: Yes   Drug use: No     Allergies   Penicillins and Doxycycline   Review of Systems Review of Systems  Constitutional: Negative for chills, diaphoresis, fatigue and fever.  HENT: Negative for congestion.   Eyes: Negative for visual disturbance.    Respiratory: Negative for cough.   Cardiovascular: Negative for chest pain.  Gastrointestinal: Positive for nausea and vomiting. Negative for abdominal distention, abdominal pain, blood in stool, constipation and diarrhea.  Genitourinary: Negative for decreased urine volume, flank pain, pelvic pain, urgency, vaginal bleeding, vaginal discharge and vaginal pain.  Musculoskeletal: Positive for back pain (mild). Negative for arthralgias, neck pain and neck stiffness.  Skin: Negative for rash and wound.  Neurological: Negative for dizziness, light-headedness and headaches.  Psychiatric/Behavioral: Negative for agitation, behavioral problems and confusion.  All other systems reviewed and are negative.    Physical Exam Updated Vital Signs BP (!) 154/103    Pulse 93    Temp 99 F (37.2 C) (Oral)    Resp 20    Ht 5\' 2"  (1.575 m)    Wt 69.9 kg    LMP 03/19/2019    SpO2 100%    BMI 28.17 kg/m   Physical Exam Vitals signs and nursing note reviewed.  Constitutional:      General: She is not in acute distress.    Appearance: She is well-developed. She is not ill-appearing, toxic-appearing or diaphoretic.  HENT:  Head: Normocephalic and atraumatic.     Right Ear: External ear normal.     Left Ear: External ear normal.     Nose: Nose normal. No congestion or rhinorrhea.     Mouth/Throat:     Pharynx: No oropharyngeal exudate.  Eyes:     Conjunctiva/sclera: Conjunctivae normal.     Pupils: Pupils are equal, round, and reactive to light.  Neck:     Musculoskeletal: Normal range of motion and neck supple. No muscular tenderness.  Cardiovascular:     Rate and Rhythm: Normal rate.     Pulses: Normal pulses.     Heart sounds: No murmur.  Pulmonary:     Effort: Pulmonary effort is normal. No respiratory distress.     Breath sounds: No stridor. No wheezing, rhonchi or rales.  Chest:     Chest wall: No tenderness.  Abdominal:     General: There is no distension.     Tenderness: There is no  abdominal tenderness. There is no right CVA tenderness, left CVA tenderness or rebound.  Musculoskeletal:        General: No tenderness.  Skin:    General: Skin is warm.     Capillary Refill: Capillary refill takes less than 2 seconds.     Findings: No erythema or rash.  Neurological:     General: No focal deficit present.     Mental Status: She is alert and oriented to person, place, and time.     Sensory: No sensory deficit.     Motor: No weakness or abnormal muscle tone.     Deep Tendon Reflexes: Reflexes are normal and symmetric.  Psychiatric:        Mood and Affect: Mood normal.      ED Treatments / Results  Labs (all labs ordered are listed, but only abnormal results are displayed) Labs Reviewed  URINALYSIS, ROUTINE W REFLEX MICROSCOPIC - Abnormal; Notable for the following components:      Result Value   Ketones, ur 15 (*)    All other components within normal limits  PREGNANCY, URINE - Abnormal; Notable for the following components:   Preg Test, Ur POSITIVE (*)    All other components within normal limits  CBC WITH DIFFERENTIAL/PLATELET  COMPREHENSIVE METABOLIC PANEL  LIPASE, BLOOD    EKG None  Radiology No results found.  Procedures Procedures (including critical care time)  Medications Ordered in ED Medications  sodium chloride 0.9 % bolus 1,000 mL (0 mLs Intravenous Stopped 04/19/19 0011)     Initial Impression / Assessment and Plan / ED Course  I have reviewed the triage vital signs and the nursing notes.  Pertinent labs & imaging results that were available during my care of the patient were reviewed by me and considered in my medical decision making (see chart for details).        Katherine Dominguez is a 28 y.o. female with no significant past medical history who presents with missed menstrual cycle, mild abdominal cramping, nausea, and vomiting.  Patient reports that she took a pregnancy test several days ago and was positive and she presents to  confirm she is pregnant as well as get assistance with her vomiting.  She does report some mild abdominal cramping but reports the last 2-week she has had nausea vomiting constantly.  She denies any severe abdominal pain and denies any vaginal bleeding or vaginal discharge.  She denies dysuria, constipation, or diarrhea.  She denies any trauma.  He denies any other  medical problems at this time.  On exam, abdomen is nontender.  Flanks are nontender.  Chest is nontender and lungs are clear.  No murmur.  No focal neurologic deficits.  Patient resting comfortably will reporting still having nausea.  Patient's screening pregnancy test was positive.  Urinalysis shows ketones but no evidence of infection.  Patient agreed to getting IV fluids for likely dehydration with all nausea vomiting as well as some screening labs to look for electrolyte imbalance.  If patient is feeling better after fluids and is able to tolerate eating and drinking, anticipate discharge with prescription for oral nausea medication as well as instructions to follow-up with OB/GYN and start taking prenatal vitamins.  Clinically patient does not look profoundly dehydrated.  Given her lack of vaginal bleeding or severe abdominal pain, we do not feel she is needs transfer tonight for pelvic ultrasound or further work-up at this time.   Patient felt much better after fluids.  Patient's work-up is reassuring with normal labs.  No evidence of UTI.  Patient would like to be discharged home and she was able to eat and drink.  Patient given prescription for nausea medication and will follow-up with OB/GYN.  She will start prenatal vitamins.  She understood return precautions and had no other questions or concerns.  Patient discharged in good condition with resolved nausea.   Final Clinical Impressions(s) / ED Diagnoses   Final diagnoses:  Less than [redacted] weeks gestation of pregnancy  Non-intractable vomiting with nausea, unspecified vomiting type      ED Discharge Orders         Ordered    Doxylamine-Pyridoxine 10-10 MG TBEC  Daily     04/19/19 0027          Clinical Impression: 1. Less than [redacted] weeks gestation of pregnancy   2. Non-intractable vomiting with nausea, unspecified vomiting type     Disposition: Discharge  Condition: Good  I have discussed the results, Dx and Tx plan with the pt(& family if present). He/she/they expressed understanding and agree(s) with the plan. Discharge instructions discussed at great length. Strict return precautions discussed and pt &/or family have verbalized understanding of the instructions. No further questions at time of discharge.    Discharge Medication List as of 04/19/2019 12:28 AM    START taking these medications   Details  Doxylamine-Pyridoxine 10-10 MG TBEC Take 1 tablet by mouth daily. (Diclegis Initial, 2 tablets (doxylamine succinate 10 mg/pyridoxine hydrochloride 10 mg per tablet) orally at bedtime on day 1 and 2; if symptoms persist, take 1 tablet in morning and 2 tablets at bedtime on day 3; if sympto ms persist, may increase to MAX 4 tablets per day, administered as 1 tablet in the morning, 1 tablet in mid-afternoon and 2 tablets at bedtime, Starting Wed 04/19/2019, Print        Follow Up: Your [facility]     [facility] [place] [facility] [address] [id number] [address] [place] [address] [phone]       Frankie Zito, Canary Brim, MD 04/19/19 [id number]

## 2019-04-18 NOTE — ED Notes (Signed)
ED Provider at bedside. 

## 2019-04-18 NOTE — ED Triage Notes (Signed)
Vomiting. Amenorrhea.

## 2019-04-19 MED ORDER — DOXYLAMINE-PYRIDOXINE 10-10 MG PO TBEC: 1.0000 | DELAYED_RELEASE_TABLET | Freq: Every day | ORAL | 0 refills | Status: DC

## 2019-04-19 NOTE — Discharge Instructions (Signed)
Your work-up today confirmed that you are pregnant.  Your labs are reassuring and your electrolytes are normal.  You felt better after fluids, please try to maintain hydration.  Please use the new nausea medicine to with your symptoms and start taking prenatal vitamins.  Please follow-up with your OB/GYN for further management.  If any symptoms change or worsen, please return to the nearest emergency department.

## 2019-04-19 NOTE — ED Notes (Signed)
ED Provider at bedside. 

## 2019-04-24 ENCOUNTER — Other Ambulatory Visit: Payer: Self-pay

## 2019-04-24 ENCOUNTER — Emergency Department (HOSPITAL_BASED_OUTPATIENT_CLINIC_OR_DEPARTMENT_OTHER)
Admission: EM | Admit: 2019-04-24 | Discharge: 2019-04-24 | Disposition: A | Discharge status: Home / Self Care | Payer: Medicaid Other | Attending: Emergency Medicine | Admitting: Emergency Medicine

## 2019-04-24 ENCOUNTER — Emergency Department (HOSPITAL_BASED_OUTPATIENT_CLINIC_OR_DEPARTMENT_OTHER): Payer: Medicaid Other

## 2019-04-24 ENCOUNTER — Encounter (HOSPITAL_BASED_OUTPATIENT_CLINIC_OR_DEPARTMENT_OTHER): Payer: Self-pay

## 2019-04-24 DIAGNOSIS — Z3A00 Weeks of gestation of pregnancy not specified: Secondary | ICD-10-CM | POA: Insufficient documentation

## 2019-04-24 DIAGNOSIS — O200 Threatened abortion: Secondary | ICD-10-CM | POA: Diagnosis not present

## 2019-04-24 DIAGNOSIS — O26859 Spotting complicating pregnancy, unspecified trimester: Secondary | ICD-10-CM | POA: Diagnosis present

## 2019-04-24 DIAGNOSIS — O209 Hemorrhage in early pregnancy, unspecified: Secondary | ICD-10-CM

## 2019-04-24 LAB — WET PREP, GENITAL
Sperm: NONE SEEN
Trich, Wet Prep: NONE SEEN
Yeast Wet Prep HPF POC: NONE SEEN

## 2019-04-24 LAB — URINALYSIS, MICROSCOPIC (REFLEX)

## 2019-04-24 LAB — URINALYSIS, ROUTINE W REFLEX MICROSCOPIC
Bilirubin Urine: NEGATIVE
Glucose, UA: NEGATIVE mg/dL
Ketones, ur: 15 mg/dL — AB
Leukocytes,Ua: NEGATIVE
Nitrite: NEGATIVE
Protein, ur: NEGATIVE mg/dL
Specific Gravity, Urine: 1.025 (ref 1.005–1.030)
pH: 6 (ref 5.0–8.0)

## 2019-04-24 LAB — HCG, QUANTITATIVE, PREGNANCY: hCG, Beta Chain, Quant, S: 1971 m[IU]/mL — ABNORMAL HIGH (ref <5)

## 2019-04-24 NOTE — ED Triage Notes (Signed)
Pt c/o vaginal spotting x today-panty liner x 1-pt states she was advised she is pregnant last ED visit-NAD-steady gait

## 2019-04-24 NOTE — ED Provider Notes (Signed)
[facility] EMERGENCY DEPARTMENT Provider Note   CSN: [id number] Arrival date & time: 04/24/19  1555     History   Chief Complaint Chief Complaint  Patient presents with  . Vaginal Bleeding    HPI Katherine Dominguez is a 28 y.o. female.     Patient is a 28 year old female who presents with vaginal cramping and spotting.  She states that she found out she was pregnant last week when she was in the emergency department for some nausea and vomiting.  She states her last menstrual period was July 26.  She is G2, P1.  She started having some lower abdominal intermittent cramping and some spotting this morning.  She denies any heavy bleeding.  No ongoing nausea or vomiting.  She called her OB[facility]GYN at [facility] and they advised her to come to the emergency department for evaluation.  She denies any urinary symptoms.  No dizziness.  On chart review, she is blood type O-.     Past Medical History:  Diagnosis Date  . Asthma     Patient Active Problem List   Diagnosis Date Noted  . Asthma with acute exacerbation 03/22/2013  . 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 Allergic rhinitis 03/22/2013    Past Surgical History:  Procedure Laterality Date  . TONSILLECTOMY       OB History    Gravida  2   Para      Term      Preterm      AB      Living        SAB      TAB      Ectopic      Multiple      Live Births               Home Medications    Prior to Admission medications   Medication Sig Start Date End Date Taking? Authorizing Provider  Doxylamine-Pyridoxine 10-10 MG TBEC Take 1 tablet by mouth daily. (Diclegis Initial, 2 tablets (doxylamine succinate 10 mg/pyridoxine hydrochloride 10 mg per tablet) orally at bedtime on day 1 and 2; if symptoms persist, take 1 tablet in morning and 2 tablets at bedtime on day 3; if symptoms persist, may increase to MAX 4 tablets per day, administered as 1 tablet in the morning, 1 tablet in mid-afternoon and 2  tablets at bedtime 04/19/19   Tegeler, Canary Brimhristopher J, MD  hydrocortisone cream 1 % Apply to affected area 2 times daily 12/23/15   Roxy HorsemanBrowning, Robert, PA-C    Family History No family history on file.  Social History Social History   Tobacco Use  . Smoking status: Never Smoker  . Smokeless tobacco: Never Used  Substance Use Topics  . Alcohol use: Yes    Comment: occ  . Drug use: No     Allergies   Penicillins and Doxycycline   Review of Systems Review of Systems  Constitutional: Negative for chills, diaphoresis, fatigue and fever.  HENT: Negative for congestion, rhinorrhea and sneezing.   Eyes: Negative.   Respiratory: Negative for cough, chest tightness and shortness of breath.   Cardiovascular: Negative for chest pain and leg swelling.  Gastrointestinal: Negative for abdominal pain, blood in stool, diarrhea, nausea and vomiting.  Genitourinary: Positive for pelvic pain (cramping) and vaginal bleeding. Negative for difficulty urinating, flank pain, frequency, hematuria and vaginal discharge.  Musculoskeletal: Negative for arthralgias and back pain.  Skin: Negative for rash.  Neurological: Negative for dizziness, speech difficulty, weakness, numbness  and headaches.     Physical Exam Updated Vital Signs BP (!) 135/99 (BP Location: Left Arm)   Pulse 80   Temp 99.6 F (37.6 C) (Oral)   Resp 16   Ht 5\' 2"  (1.575 m)   Wt 63.5 kg   LMP 03/19/2019   SpO2 100%   BMI 25.61 kg/m   Physical Exam Constitutional:      Appearance: She is well-developed.  HENT:     Head: Normocephalic and atraumatic.  Eyes:     Pupils: Pupils are equal, round, and reactive to light.  Neck:     Musculoskeletal: Normal range of motion and neck supple.  Cardiovascular:     Rate and Rhythm: Normal rate and regular rhythm.     Heart sounds: Normal heart sounds.  Pulmonary:     Effort: Pulmonary effort is normal. No respiratory distress.     Breath sounds: Normal breath sounds. No wheezing or  rales.  Chest:     Chest wall: No tenderness.  Abdominal:     General: Bowel sounds are normal.     Palpations: Abdomen is soft.     Tenderness: There is abdominal tenderness (Mild tenderness across the lower abdomen). There is no guarding or rebound.  Genitourinary:    Comments: Scant white discharge, no cervical motion tenderness or adnexal tenderness, cervix is closed, no bleeding noted Musculoskeletal: Normal range of motion.  Lymphadenopathy:     Cervical: No cervical adenopathy.  Skin:    General: Skin is warm and dry.     Findings: No rash.  Neurological:     Mental Status: She is alert and oriented to person, place, and time.      ED Treatments / Results  Labs (all labs ordered are listed, but only abnormal results are displayed) Labs Reviewed  WET PREP, GENITAL - Abnormal; Notable for the following components:      Result Value   Clue Cells Wet Prep HPF POC PRESENT (*)    WBC, Wet Prep HPF POC MANY (*)    All other components within normal limits  URINALYSIS, ROUTINE W REFLEX MICROSCOPIC - Abnormal; Notable for the following components:   Hgb urine dipstick LARGE (*)    Ketones, ur 15 (*)    All other components within normal limits  HCG, QUANTITATIVE, PREGNANCY - Abnormal; Notable for the following components:   hCG, Beta Chain, Quant, S 1,971 (*)    All other components within normal limits  URINALYSIS, MICROSCOPIC (REFLEX) - Abnormal; Notable for the following components:   Bacteria, UA MANY (*)    All other components within normal limits  GC/CHLAMYDIA PROBE AMP ([place]) NOT AT [facility]    EKG None  Radiology US Ob Less Than 14 Weeks With Ob Transvaginal  Result Date: 04/24/2019 CLINICAL DATA:  Vaginal bleeding for 1 day. By LMP the patient is 5 weeks 1 day. EDC by LMP is 12/24/2019. Quantitative beta HCG is 1971.0. EXAM: OBSTETRIC <14 WK [place] AND TRANSVAGINAL OB US TECHNIQUE: Both transabdominal and transvaginal ultrasound examinations were performed for  complete evaluation of the gestation as well as the maternal uterus, adnexal regions, and pelvic cul-de-sac. Transvaginal technique was performed to assess early pregnancy. COMPARISON:  11/06/2013 FINDINGS: Intrauterine gestational sac: None Yolk sac:  Not Visualized. Embryo:  Not Visualized. Cardiac Activity: Not Visualized. Heart Rate: Not applicable bpm Subchorionic hemorrhage:  None visualized. Maternal uterus/adnexae: Normal appearance of the ovaries. LEFT corpus luteum cyst is present. Small amount of free pelvic fluid. IMPRESSION: Pregnancy  of unknown location. Considerations include completed spontaneous abortion, early intrauterine pregnancy or early ectopic pregnancy. Serial quantitative beta HCG values and follow-up ultrasound are recommended as appropriate to document progression of and location of pregnancy. Ectopic pregnancy has not been excluded. Electronically Signed   By: Nolon Nations M.D.   On: 04/24/2019 18:15    Procedures Procedures (including critical care time)  Medications Ordered in ED Medications - No data to display   Initial Impression / Assessment and Plan / ED Course  I have reviewed the triage vital signs and the nursing notes.  Pertinent labs & imaging results that were available during my care of the patient were reviewed by me and considered in my medical decision making (see chart for details).        Patient is a 28 year old female who presents with lower abdominal cramping and spotting.  She did not have any vaginal bleeding on my exam.  She is Rh- but without bleeding, she does not need to be treated with RhoGam at this point.  Her quant hCG is around 1900.  Ultrasound did not reveal any evidence of intrauterine pregnancy.  There was no evidence of ectopic although it cannot completely be excluded at this point.  She currently is not having any significant abdominal pain.  She was discharged home in good condition.  She was advised that she needs to have  her hormone levels checked and a reevaluation in 2 days which can be done by her OB/GYN in [place].  She was given strict return precautions.  She did have some clue cells on the wet prep although she is not symptomatic so I will not treat her at this point.  Final Clinical Impressions(s) / ED Diagnoses   Final diagnoses:  Threatened miscarriage    ED Discharge Orders    None       Malvin Johns, MD 04/24/19 1840

## 2019-04-24 NOTE — Discharge Instructions (Addendum)
A pregnancy was not visualized on ultrasound.  We cannot completely exclude a pregnancy in your tubes at this point.  You need to follow-up with your OB/GYN in 2 days to have a reevaluation.  If you have any worsening abdominal pain, heavier vaginal bleeding or other worsening symptoms, you need to seek urgent evaluation in the emergency department.

## 2019-04-24 NOTE — ED Notes (Signed)
ED Provider at bedside. 

## 2019-04-26 LAB — GC/CHLAMYDIA PROBE AMP (~~LOC~~) NOT AT ARMC
Chlamydia: NEGATIVE
Neisseria Gonorrhea: NEGATIVE

## 2020-04-23 ENCOUNTER — Emergency Department (HOSPITAL_BASED_OUTPATIENT_CLINIC_OR_DEPARTMENT_OTHER)
Admission: EM | Admit: 2020-04-23 | Discharge: 2020-04-23 | Disposition: A | Discharge status: Home / Self Care | Payer: PRIVATE HEALTH INSURANCE | Attending: Emergency Medicine | Admitting: Emergency Medicine

## 2020-04-23 ENCOUNTER — Other Ambulatory Visit: Payer: Self-pay

## 2020-04-23 ENCOUNTER — Encounter (HOSPITAL_BASED_OUTPATIENT_CLINIC_OR_DEPARTMENT_OTHER): Payer: Self-pay | Admitting: *Deleted

## 2020-04-23 DIAGNOSIS — R0981 Nasal congestion: Secondary | ICD-10-CM | POA: Diagnosis present

## 2020-04-23 DIAGNOSIS — J45909 Unspecified asthma, uncomplicated: Secondary | ICD-10-CM | POA: Diagnosis not present

## 2020-04-23 DIAGNOSIS — J0120 Acute ethmoidal sinusitis, unspecified: Secondary | ICD-10-CM

## 2020-04-23 MED ORDER — CEFPODOXIME PROXETIL 200 MG PO TABS: 200.0000 mg | ORAL_TABLET | Freq: Two times a day (BID) | ORAL | 0 refills | Status: DC

## 2020-04-23 NOTE — ED Triage Notes (Signed)
Pt reports sinus and face pressure, increased since taking a nasal spray 3 days ago. Denies cough, sob, fevers or any other c/o.

## 2020-04-23 NOTE — ED Provider Notes (Signed)
[facility] EMERGENCY DEPARTMENT Provider Note   CSN: [id number] Arrival date & time: 04/23/20  7628     History Chief Complaint  Patient presents with  . Facial Pain    Katherine Dominguez is a 29 y.o. female.  29 yo F with a chief complaints of pain behind her nose.  This been going on for a few days.  Having some congestion.  Has tried over-the-counter medicines without much improvement.  Has had some nosebleeds off and on.  Denies ear pain denies fevers denies dental pain denies difficulty with swallowing.  The history is provided by the patient.  Illness Severity:  Moderate Onset quality:  Gradual Duration:  4 days Timing:  Constant Progression:  Worsening Chronicity:  New Associated symptoms: congestion, cough and rhinorrhea   Associated symptoms: no chest pain, no fever, no headaches, no myalgias, no nausea, no shortness of breath, no vomiting and no wheezing        Past Medical History:  Diagnosis Date  . Asthma     Patient Active Problem List   Diagnosis Date Noted  . Asthma with acute exacerbation 03/22/2013  . 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 Allergic rhinitis 03/22/2013    Past Surgical History:  Procedure Laterality Date  . TONSILLECTOMY       OB History    Gravida  2   Para      Term      Preterm      AB      Living        SAB      TAB      Ectopic      Multiple      Live Births              History reviewed. No pertinent family history.  Social History   Tobacco Use  . Smoking status: Never Smoker  . Smokeless tobacco: Never Used  Vaping Use  . Vaping Use: Never used  Substance Use Topics  . Alcohol use: Yes    Comment: occ  . Drug use: No    Home Medications Prior to Admission medications   Medication Sig Start Date End Date Taking? Authorizing Provider  cefpodoxime (VANTIN) 200 MG tablet Take 1 tablet (200 mg total) by mouth 2 (two) times daily. 04/23/20   Melene Plan, DO  Doxylamine-Pyridoxine 10-10  MG TBEC Take 1 tablet by mouth daily. (Diclegis Initial, 2 tablets (doxylamine succinate 10 mg/pyridoxine hydrochloride 10 mg per tablet) orally at bedtime on day 1 and 2; if symptoms persist, take 1 tablet in morning and 2 tablets at bedtime on day 3; if symptoms persist, may increase to MAX 4 tablets per day, administered as 1 tablet in the morning, 1 tablet in mid-afternoon and 2 tablets at bedtime 04/19/19   Tegeler, Canary Brim, MD  hydrocortisone cream 1 % Apply to affected area 2 times daily 12/23/15   Roxy Horseman, PA-C    Allergies    Penicillins and Doxycycline  Review of Systems   Review of Systems  Constitutional: Negative for chills and fever.  HENT: Positive for congestion and rhinorrhea.   Eyes: Negative for redness and visual disturbance.  Respiratory: Positive for cough. Negative for shortness of breath and wheezing.   Cardiovascular: Negative for chest pain and palpitations.  Gastrointestinal: Negative for nausea and vomiting.  Genitourinary: Negative for dysuria and urgency.  Musculoskeletal: Negative for arthralgias and myalgias.  Skin: Negative for pallor and wound.  Neurological: Negative for dizziness and  headaches.    Physical Exam Updated Vital Signs BP 123/76 (BP Location: Right Arm)   Pulse 84   Temp 98.5 F (36.9 C) (Oral)   Resp 16   Ht 5\' 6"  (1.676 m)   Wt 64.9 kg   LMP 04/02/2020   SpO2 100%   BMI 23.09 kg/m   Physical Exam Vitals and nursing note reviewed.  Constitutional:      General: She is not in acute distress.    Appearance: She is well-developed. She is not diaphoretic.  HENT:     Head: Normocephalic and atraumatic.     Comments: Swollen turbinates, posterior nasal drip, no noted sinus ttp, tm normal bilaterally.   Good dentition, uvula midline, tolerating secretions without difficulty Eyes:     Pupils: Pupils are equal, round, and reactive to light.  Cardiovascular:     Rate and Rhythm: Normal rate and regular rhythm.      Heart sounds: No murmur heard.  No friction rub. No gallop.   Pulmonary:     Effort: Pulmonary effort is normal.     Breath sounds: No wheezing or rales.  Abdominal:     General: There is no distension.     Palpations: Abdomen is soft.     Tenderness: There is no abdominal tenderness.  Musculoskeletal:        General: No tenderness.     Cervical back: Normal range of motion and neck supple.  Skin:    General: Skin is warm and dry.  Neurological:     Mental Status: She is alert and oriented to person, place, and time.  Psychiatric:        Behavior: Behavior normal.     ED Results / Procedures / Treatments   Labs (all labs ordered are listed, but only abnormal results are displayed) Labs Reviewed - No data to display  EKG None  Radiology No results found.  Procedures Procedures (including critical care time)  Medications Ordered in ED Medications - No data to display  ED Course  I have reviewed the triage vital signs and the nursing notes.  Pertinent labs & imaging results that were available during my care of the patient were reviewed by me and considered in my medical decision making (see chart for details).    MDM Rules/Calculators/A&P                          29 yo F with a chief complaints of sinus pain and pressure.  Most likely the patient has acute sinusitis.  She is describing severe pain.  Will prescribe a course of antibiotics.  Unfortunately the patient has multiple antibiotic allergies.  Unable to give Augmentin or doxycycline.  We will give Vantin.  PCP follow-up.  9:41 AM:  I have discussed the diagnosis/risks/treatment options with the patient and believe the pt to be eligible for discharge home to follow-up with PCP. We also discussed returning to the ED immediately if new or worsening sx occur. We discussed the sx which are most concerning (e.g., sudden worsening pain, fever, inability to tolerate by mouth) that necessitate immediate return.  Medications administered to the patient during their visit and any new prescriptions provided to the patient are listed below.  Medications given during this visit Medications - No data to display   The patient appears reasonably screen and/or stabilized for discharge and I doubt any other medical condition or other [facility] requiring further screening, evaluation, or treatment in the  ED at this time prior to discharge.   Final Clinical Impression(s) / ED Diagnoses Final diagnoses:  Acute ethmoidal sinusitis, recurrence not specified    Rx / DC Orders ED Discharge Orders         Ordered    cefpodoxime (VANTIN) 200 MG tablet  2 times daily        04/23/20 0936           Melene Plan, DO 04/23/20 [phone]

## 2020-04-23 NOTE — Discharge Instructions (Signed)
Take tylenol 2 pills 4 times a day and motrin 4 pills 3 times a day.  Drink plenty of fluids.  Return for worsening shortness of breath, headache, confusion. Follow up with your family doctor.   Try Sudafed the medicine you after your driver's license to buy at the pharmacy.  You can try Flonase and then try an over-the-counter antihistamine.  Please return for fever difficulty eating or drinking or facial swelling.

## 2020-04-27 ENCOUNTER — Telehealth: Payer: Self-pay

## 2020-04-27 NOTE — Telephone Encounter (Signed)
[facility] called to state that the medication ordered does have a cotradiction with PCN. Zpac was suggested. Dr. Adela Lank had asked for suggestion from pharmacist based on allergy. Zpac ordered for patient. After pharmacist recommended.

## 2021-05-15 ENCOUNTER — Other Ambulatory Visit: Payer: Self-pay

## 2021-05-15 ENCOUNTER — Encounter (HOSPITAL_COMMUNITY): Payer: Self-pay | Admitting: *Deleted

## 2021-05-15 ENCOUNTER — Ambulatory Visit (HOSPITAL_COMMUNITY)
Admission: EM | Admit: 2021-05-15 | Discharge: 2021-05-15 | Disposition: A | Discharge status: Home / Self Care | Payer: PRIVATE HEALTH INSURANCE | Attending: Internal Medicine | Admitting: Internal Medicine

## 2021-05-15 DIAGNOSIS — N898 Other specified noninflammatory disorders of vagina: Secondary | ICD-10-CM | POA: Insufficient documentation

## 2021-05-15 LAB — POCT URINALYSIS DIPSTICK, ED / UC
Bilirubin Urine: NEGATIVE
Glucose, UA: NEGATIVE mg/dL
Hgb urine dipstick: NEGATIVE
Ketones, ur: 15 mg/dL — AB
Leukocytes,Ua: NEGATIVE
Nitrite: NEGATIVE
Protein, ur: NEGATIVE mg/dL
Specific Gravity, Urine: >=1.03 (ref 1.005–1.030)
Urobilinogen, UA: 0.2 mg/dL (ref 0.0–1.0)
pH: 5.5 (ref 5.0–8.0)

## 2021-05-15 LAB — POC URINE PREG, ED: Preg Test, Ur: NEGATIVE

## 2021-05-15 NOTE — ED Triage Notes (Signed)
Pt reports UTI sx's for 3 days. Low ABD pain

## 2021-05-15 NOTE — Discharge Instructions (Signed)
Will notify of results once available. Follow up with any further concerns.  

## 2021-05-15 NOTE — ED Provider Notes (Signed)
[facility]    CSN: [id number] Arrival date & time: 05/15/21  1840      History   Chief Complaint Chief Complaint  Patient presents with   UTI    HPI Katherine Dominguez is a 30 y.o. female.   Patient here today for evaluation of a "warm" feeling in her genital area.  She initially thought she might have a UTI, but questions that she might have BV or a yeast infection.  She has had some vaginal discharge.  She denies any nausea or vomiting.  She has not had any abdominal pain.  The history is provided by the patient.   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 Authorizing Provider  cefpodoxime (VANTIN) 200 MG tablet Take 1 tablet (200 mg total) by mouth 2 (two) times daily. 04/23/20   Melene Plan, DO  Doxylamine-Pyridoxine 10-10 MG TBEC Take 1 tablet by mouth daily. (Diclegis Initial, 2 tablets (doxylamine succinate 10 mg/pyridoxine hydrochloride 10 mg per tablet) orally at bedtime on day 1 and 2; if symptoms persist, take 1 tablet in morning and 2 tablets at bedtime on day 3; if symptoms persist, may increase to MAX 4 tablets per day, administered as 1 tablet in the morning, 1 tablet in mid-afternoon and 2 tablets at bedtime 04/19/19   Tegeler, Canary Brim, MD  hydrocortisone cream 1 % Apply to affected area 2 times daily 12/23/15   Roxy Horseman, PA-C    Family History History reviewed. No pertinent family history.  Social History Social  History   Tobacco Use   Smoking status: Never   Smokeless tobacco: Never  Vaping Use   Vaping Use: Never used  Substance Use Topics   Alcohol use: Yes    Comment: occ   Drug use: No     Allergies   Penicillins and Doxycycline   Review of Systems Review of Systems  Constitutional:  Negative for chills and fever.  Respiratory:  Negative for shortness of breath.   Gastrointestinal:  Negative for abdominal pain, nausea and vomiting.  Genitourinary:  Positive for vaginal discharge. Negative for dysuria and frequency.  Musculoskeletal:  Negative for back pain.    Physical Exam Triage Vital Signs ED Triage Vitals  Enc Vitals Group     BP --      Pulse Rate 05/15/21 1907 74     Resp 05/15/21 1907 16     Temp 05/15/21 1907 98.6 F (37 C)     Temp src --      SpO2 05/15/21 1907 100 %     Weight --      Height --  Head Circumference --      Peak Flow --      Pain Score 05/15/21 1908 0     Pain Loc --      Pain Edu? --      Excl. in GC? --    No data found.  Updated Vital Signs Pulse 74   Temp 98.6 F (37 C)   Resp 16   LMP 05/02/2021   SpO2 100%      Physical Exam Vitals and nursing note reviewed.  Constitutional:      General: She is not in acute distress.    Appearance: Normal appearance. She is not ill-appearing.  HENT:     Head: Normocephalic and atraumatic.  Eyes:     Conjunctiva/sclera: Conjunctivae normal.  Cardiovascular:     Rate and Rhythm: Normal rate.  Pulmonary:     Effort: Pulmonary effort is normal.  Neurological:     Mental Status: She is alert.  Psychiatric:        Mood and Affect: Mood normal.        Behavior: Behavior normal.        Thought Content: Thought content normal.     UC Treatments / Results  Labs (all labs ordered are listed, but only abnormal results are displayed) Labs Reviewed  POCT URINALYSIS DIPSTICK, ED / UC - Abnormal; Notable for the following components:      Result Value   Ketones, ur 15 (*)    All  other components within normal limits  URINE CULTURE  POC URINE PREG, ED  CERVICOVAGINAL ANCILLARY ONLY    EKG   Radiology No results found.  Procedures Procedures (including critical care time)  Medications Ordered in UC Medications - No data to display  Initial Impression / Assessment and Plan / UC Course  I have reviewed the triage vital signs and the nursing notes.  Pertinent labs & imaging results that were available during my care of the patient were reviewed by me and considered in my medical decision making (see chart for details).  UA without signs of urinary tract infection, but will order culture to be on the safe side.  Swab also ordered to rule out BV or yeast as well as other STD.  Will await results for further recommendation.  Final Clinical Impressions(s) / UC Diagnoses   Final diagnoses:  Vaginal discharge     Discharge Instructions      Will notify of results once available. Follow up with any further concerns.      ED Prescriptions   None    PDMP not reviewed this encounter.   Tomi Bamberger, PA-C 05/15/21 2024

## 2021-05-16 ENCOUNTER — Telehealth (HOSPITAL_COMMUNITY): Payer: Self-pay | Admitting: Emergency Medicine

## 2021-05-16 LAB — CERVICOVAGINAL ANCILLARY ONLY
Bacterial Vaginitis (gardnerella): POSITIVE — AB
Candida Glabrata: NEGATIVE
Candida Vaginitis: NEGATIVE
Chlamydia: NEGATIVE
Comment: NEGATIVE
Comment: NEGATIVE
Comment: NEGATIVE
Comment: NEGATIVE
Comment: NEGATIVE
Comment: NORMAL
Neisseria Gonorrhea: NEGATIVE
Trichomonas: NEGATIVE

## 2021-05-16 MED ORDER — METRONIDAZOLE 500 MG PO TABS: 500.0000 mg | ORAL_TABLET | Freq: Two times a day (BID) | ORAL | 0 refills | Status: DC

## 2021-05-17 LAB — URINE CULTURE: Culture: >=100000 — AB

## 2021-12-31 ENCOUNTER — Encounter (HOSPITAL_BASED_OUTPATIENT_CLINIC_OR_DEPARTMENT_OTHER): Payer: Self-pay

## 2021-12-31 ENCOUNTER — Other Ambulatory Visit: Payer: Self-pay

## 2021-12-31 ENCOUNTER — Emergency Department (HOSPITAL_BASED_OUTPATIENT_CLINIC_OR_DEPARTMENT_OTHER)
Admission: EM | Admit: 2021-12-31 | Discharge: 2021-12-31 | Disposition: A | Discharge status: Home / Self Care | Payer: Self-pay | Attending: Emergency Medicine | Admitting: Emergency Medicine

## 2021-12-31 ENCOUNTER — Emergency Department (HOSPITAL_BASED_OUTPATIENT_CLINIC_OR_DEPARTMENT_OTHER): Payer: Self-pay

## 2021-12-31 DIAGNOSIS — R000 Tachycardia, unspecified: Secondary | ICD-10-CM | POA: Insufficient documentation

## 2021-12-31 DIAGNOSIS — J45909 Unspecified asthma, uncomplicated: Secondary | ICD-10-CM | POA: Insufficient documentation

## 2021-12-31 DIAGNOSIS — R0602 Shortness of breath: Secondary | ICD-10-CM | POA: Insufficient documentation

## 2021-12-31 DIAGNOSIS — N12 Tubulo-interstitial nephritis, not specified as acute or chronic: Secondary | ICD-10-CM | POA: Insufficient documentation

## 2021-12-31 DIAGNOSIS — R519 Headache, unspecified: Secondary | ICD-10-CM | POA: Insufficient documentation

## 2021-12-31 DIAGNOSIS — R0789 Other chest pain: Secondary | ICD-10-CM | POA: Insufficient documentation

## 2021-12-31 HISTORY — DX: Migraine, unspecified, not intractable, without status migrainosus: G43.909

## 2021-12-31 LAB — LIPASE, BLOOD: Lipase: 20 U/L (ref 11–51)

## 2021-12-31 LAB — CBC WITH DIFFERENTIAL/PLATELET
Abs Immature Granulocytes: 0.06 10*3/uL (ref 0.00–0.07)
Basophils Absolute: 0 10*3/uL (ref 0.0–0.1)
Basophils Relative: 0 %
Eosinophils Absolute: 0 10*3/uL (ref 0.0–0.5)
Eosinophils Relative: 0 %
HCT: 35.2 % — ABNORMAL LOW (ref 36.0–46.0)
Hemoglobin: 12.1 g/dL (ref 12.0–15.0)
Immature Granulocytes: 0 %
Lymphocytes Relative: 7 %
Lymphs Abs: 1.1 10*3/uL (ref 0.7–4.0)
MCH: 28.3 pg (ref 26.0–34.0)
MCHC: 34.4 g/dL (ref 30.0–36.0)
MCV: 82.2 fL (ref 80.0–100.0)
Monocytes Absolute: 1.5 10*3/uL — ABNORMAL HIGH (ref 0.1–1.0)
Monocytes Relative: 9 %
Neutro Abs: 13.2 10*3/uL — ABNORMAL HIGH (ref 1.7–7.7)
Neutrophils Relative %: 84 %
Platelets: 252 10*3/uL (ref 150–400)
RBC: 4.28 MIL/uL (ref 3.87–5.11)
RDW: 13.5 % (ref 11.5–15.5)
WBC: 15.8 10*3/uL — ABNORMAL HIGH (ref 4.0–10.5)
nRBC: 0 % (ref 0.0–0.2)

## 2021-12-31 LAB — URINALYSIS, ROUTINE W REFLEX MICROSCOPIC
Bilirubin Urine: NEGATIVE
Glucose, UA: NEGATIVE mg/dL
Ketones, ur: 15 mg/dL — AB
Nitrite: POSITIVE — AB
Protein, ur: 100 mg/dL — AB
Specific Gravity, Urine: 1.025 (ref 1.005–1.030)
pH: 5.5 (ref 5.0–8.0)

## 2021-12-31 LAB — COMPREHENSIVE METABOLIC PANEL
ALT: 14 U/L (ref 0–44)
AST: 16 U/L (ref 15–41)
Albumin: 3.5 g/dL (ref 3.5–5.0)
Alkaline Phosphatase: 50 U/L (ref 38–126)
Anion gap: 8 (ref 5–15)
BUN: 19 mg/dL (ref 6–20)
CO2: 22 mmol/L (ref 22–32)
Calcium: 8.5 mg/dL — ABNORMAL LOW (ref 8.9–10.3)
Chloride: 106 mmol/L (ref 98–111)
Creatinine, Ser: 0.95 mg/dL (ref 0.44–1.00)
GFR, Estimated: >60 mL/min (ref >60)
Glucose, Bld: 103 mg/dL — ABNORMAL HIGH (ref 70–99)
Potassium: 3.4 mmol/L — ABNORMAL LOW (ref 3.5–5.1)
Sodium: 136 mmol/L (ref 135–145)
Total Bilirubin: 0.8 mg/dL (ref 0.3–1.2)
Total Protein: 6.9 g/dL (ref 6.5–8.1)

## 2021-12-31 LAB — URINALYSIS, MICROSCOPIC (REFLEX): WBC, UA: >50 WBC/hpf (ref 0–5)

## 2021-12-31 LAB — HCG, SERUM, QUALITATIVE: Preg, Serum: NEGATIVE

## 2021-12-31 MED ORDER — SODIUM CHLORIDE 0.9 % IV BOLUS
1000.0000 mL | Freq: Once | INTRAVENOUS | Status: AC
  Administered 2021-12-31: 1000 mL via INTRAVENOUS

## 2021-12-31 MED ORDER — SODIUM CHLORIDE 0.9 % IV SOLN
1.0000 g | Freq: Once | INTRAVENOUS | Status: AC
  Administered 2021-12-31: 1 g via INTRAVENOUS
  Filled 2021-12-31: qty 10

## 2021-12-31 MED ORDER — KETOROLAC TROMETHAMINE 15 MG/ML IJ SOLN
15.0000 mg | Freq: Once | INTRAMUSCULAR | Status: AC | PRN
  Administered 2021-12-31: 15 mg via INTRAVENOUS
  Filled 2021-12-31 (×2): qty 1

## 2021-12-31 MED ORDER — DROPERIDOL 2.5 MG/ML IJ SOLN
1.2500 mg | Freq: Once | INTRAMUSCULAR | Status: AC
  Administered 2021-12-31: 1.25 mg via INTRAVENOUS
  Filled 2021-12-31: qty 2

## 2021-12-31 MED ORDER — CEFPODOXIME PROXETIL 200 MG PO TABS: 200.0000 mg | ORAL_TABLET | Freq: Two times a day (BID) | ORAL | 0 refills | Status: DC

## 2021-12-31 MED ORDER — PANTOPRAZOLE SODIUM 40 MG IV SOLR
40.0000 mg | Freq: Once | INTRAVENOUS | Status: AC
  Administered 2021-12-31: 40 mg via INTRAVENOUS
  Filled 2021-12-31: qty 10

## 2021-12-31 MED ORDER — METOCLOPRAMIDE HCL 5 MG/ML IJ SOLN
10.0000 mg | Freq: Once | INTRAMUSCULAR | Status: AC
  Administered 2021-12-31: 10 mg via INTRAVENOUS
  Filled 2021-12-31: qty 2

## 2021-12-31 NOTE — ED Provider Notes (Signed)
? ?[facility] ?Provider Note: Katherine DellJ. Lane Dani Danis, MD, FACEP ? ?CSN: [id number] ?MRN: [id number] ?ARRIVAL: 12/31/21 at 0214 ?ROOM: [facility]09 ? ? ?CHIEF COMPLAINT  ?Headache and Abdominal Pain ? ? ?HISTORY OF PRESENT ILLNESS  ?12/31/21 2:27 AM ?Katherine Dominguez is a 31 y.o. female with history of migraines who awakened at 6 AM the day before yesterday with a frontal headache.  She was seen at [facility] where she was found to be negative for strep (culture pending).  She had an unremarkable chest x-ray, and she was treated with Tylenol in triage but left before being seen.  The headache is frontal and it is moderate to severe in intensity.  She has taken ibuprofen and naproxen without relief.  She has had associated nausea and photophobia. ? ?About 1 AM today she awakened with left lower chest and left upper quadrant abdominal pain.  She describes the pain as sharp and rates it as a 10 out of 10.  It is worse with movement and deep breathing.  It makes her feel short of breath like an asthma attack but her inhaler did not help. ? ? ?Past Medical History:  ?Diagnosis Date  ? Asthma   ? Migraine   ? ? ?Past Surgical History:  ?Procedure Laterality Date  ? ECTOPIC PREGNANCY SURGERY    ? TONSILLECTOMY    ? ? ?No family history on file. ? ?Social History  ? ?Tobacco Use  ? Smoking status: Never  ? Smokeless tobacco: Never  ?Vaping Use  ? Vaping Use: Never used  ?Substance Use Topics  ? Alcohol use: Yes  ?  Comment: occ  ? Drug use: No  ? ? ?Prior to Admission medications   ?Medication Sig Start Date End Date Taking? Authorizing Provider  ?cefpodoxime (VANTIN) 200 MG tablet Take 1 tablet (200 mg total) by mouth 2 (two) times daily. 04/23/20   Melene PlanFloyd, Dan, DO  ?Doxylamine-Pyridoxine 10-10 MG TBEC Take 1 tablet by mouth daily. (Diclegis Initial, 2 tablets (doxylamine succinate 10 mg/pyridoxine hydrochloride 10 mg per tablet) orally at bedtime on day 1 and 2; if symptoms persist, take 1 tablet in morning and 2 tablets  at bedtime on day 3; if symptoms persist, may increase to MAX 4 tablets per day, administered as 1 tablet in the morning, 1 tablet in mid-afternoon and 2 tablets at bedtime 04/19/19   Tegeler, Canary Brimhristopher J, MD  ?hydrocortisone cream 1 % Apply to affected area 2 times daily 12/23/15   Roxy HorsemanBrowning, Robert, PA-C  ?metroNIDAZOLE (FLAGYL) 500 MG tablet Take 1 tablet (500 mg total) by mouth 2 (two) times daily. 05/16/21   Lamptey, Britta MccreedyPhilip O, MD  ? ? ?Allergies ?Doxycycline and Penicillins ? ? ?REVIEW OF SYSTEMS  ?Negative except as noted here or in the History of Present Illness. ? ? ?PHYSICAL EXAMINATION  ?Initial Vital Signs ?Blood pressure 128/76, pulse (!) 101, temperature 99.4 ?F (37.4 ?C), temperature source Oral, resp. rate 18, height 5\' 2"  (1.575 m), weight 64.4 kg, last menstrual period 12/08/2021, SpO2 98 %, not currently breastfeeding. ? ?Examination ?General: Well-developed, well-nourished female in no acute distress; appearance consistent with age of record ?HENT: normocephalic; atraumatic ?Eyes: pupils equal, round and reactive to light; extraocular muscles intact; photophobia ?Neck: supple; no meningeal signs ?Heart: regular rate and rhythm; tachycardia ?Lungs: clear to auscultation bilaterally ?Chest: Nontender ?Abdomen: soft; nondistended; left upper quadrant tenderness; bowel sounds present ?GU: Left CVA tenderness ?Extremities: No deformity; full range of motion; pulses normal ?Neurologic: Awake, alert and oriented; motor function  intact in all extremities and symmetric; no facial droop ?Skin: Warm and dry ?Psychiatric: Normal mood and affect ? ? ?RESULTS  ?Summary of this visit's results, reviewed and interpreted by myself: ? ? EKG Interpretation ? ?Date/Time:  Wednesday Dec 31 2021 02:27:30 EDT ?Ventricular Rate:  103 ?PR Interval:  120 ?QRS Duration: 78 ?QT Interval:  303 ?QTC Calculation: 397 ?R Axis:   75 ?Text Interpretation: Sinus tachycardia Otherwise normal ECG No previous ECGs available Confirmed by  Jeanie Mccard, Jonny Ruiz ([id number]) on 12/31/2021 2:31:36 AM ?  ? ?  ? ?Laboratory Studies: ?Results for orders placed or performed during the hospital encounter of 12/31/21 (from the past 24 hour(s))  ?CBC with Differential     Status: Abnormal  ? Collection Time: 12/31/21  2:51 AM  ?Result Value Ref Range  ? WBC 15.8 (H) 4.0 - 10.5 K/uL  ? RBC 4.28 3.87 - 5.11 MIL/uL  ? Hemoglobin 12.1 12.0 - 15.0 g/dL  ? HCT 35.2 (L) 36.0 - 46.0 %  ? MCV 82.2 80.0 - 100.0 fL  ? MCH 28.3 26.0 - 34.0 pg  ? MCHC 34.4 30.0 - 36.0 g/dL  ? RDW 13.5 11.5 - 15.5 %  ? Platelets 252 150 - 400 K/uL  ? nRBC 0.0 0.0 - 0.2 %  ? Neutrophils Relative % 84 %  ? Neutro Abs 13.2 (H) 1.7 - 7.7 K/uL  ? Lymphocytes Relative 7 %  ? Lymphs Abs 1.1 0.7 - 4.0 K/uL  ? Monocytes Relative 9 %  ? Monocytes Absolute 1.5 (H) 0.1 - 1.0 K/uL  ? Eosinophils Relative 0 %  ? Eosinophils Absolute 0.0 0.0 - 0.5 K/uL  ? Basophils Relative 0 %  ? Basophils Absolute 0.0 0.0 - 0.1 K/uL  ? Immature Granulocytes 0 %  ? Abs Immature Granulocytes 0.06 0.00 - 0.07 K/uL  ?Lipase, blood     Status: None  ? Collection Time: 12/31/21  2:51 AM  ?Result Value Ref Range  ? Lipase 20 11 - 51 U/L  ?Comprehensive metabolic panel     Status: Abnormal  ? Collection Time: 12/31/21  2:51 AM  ?Result Value Ref Range  ? Sodium 136 135 - 145 mmol/L  ? Potassium 3.4 (L) 3.5 - 5.1 mmol/L  ? Chloride 106 98 - 111 mmol/L  ? CO2 22 22 - 32 mmol/L  ? Glucose, Bld 103 (H) 70 - 99 mg/dL  ? BUN 19 6 - 20 mg/dL  ? Creatinine, Ser 0.95 0.44 - 1.00 mg/dL  ? Calcium 8.5 (L) 8.9 - 10.3 mg/dL  ? Total Protein 6.9 6.5 - 8.1 g/dL  ? Albumin 3.5 3.5 - 5.0 g/dL  ? AST 16 15 - 41 U/L  ? ALT 14 0 - 44 U/L  ? Alkaline Phosphatase 50 38 - 126 U/L  ? Total Bilirubin 0.8 0.3 - 1.2 mg/dL  ? GFR, Estimated >60 >60 mL/min  ? Anion gap 8 5 - 15  ?hCG, serum, qualitative     Status: None  ? Collection Time: 12/31/21  2:51 AM  ?Result Value Ref Range  ? Preg, Serum NEGATIVE NEGATIVE  ?Urinalysis, Routine w reflex microscopic Urine, Clean  Catch     Status: Abnormal  ? Collection Time: 12/31/21  2:53 AM  ?Result Value Ref Range  ? Color, Urine YELLOW YELLOW  ? APPearance TURBID (A) CLEAR  ? Specific Gravity, Urine 1.025 1.005 - 1.030  ? pH 5.5 5.0 - 8.0  ? Glucose, UA NEGATIVE NEGATIVE mg/dL  ? Hgb urine dipstick MODERATE (  A) NEGATIVE  ? Bilirubin Urine NEGATIVE NEGATIVE  ? Ketones, ur 15 (A) NEGATIVE mg/dL  ? Protein, ur 100 (A) NEGATIVE mg/dL  ? Nitrite POSITIVE (A) NEGATIVE  ? Leukocytes,Ua SMALL (A) NEGATIVE  ?Urinalysis, Microscopic (reflex)     Status: Abnormal  ? Collection Time: 12/31/21  2:53 AM  ?Result Value Ref Range  ? RBC / HPF 6-10 0 - 5 RBC/hpf  ? WBC, UA >50 0 - 5 WBC/hpf  ? Bacteria, UA MANY (A) NONE SEEN  ? Squamous Epithelial / LPF 0-5 0 - 5  ? WBC Clumps PRESENT   ? ?Imaging Studies: ?DG Chest 2 View ? ?Result Date: 12/31/2021 ?CLINICAL DATA:  Chest pain EXAM: CHEST - 2 VIEW COMPARISON:  03/14/2013 FINDINGS: The heart size and mediastinal contours are within normal limits. Both lungs are clear. The visualized skeletal structures are unremarkable. IMPRESSION: Normal study. Electronically Signed   By: Charlett Nose M.D.   On: 12/31/2021 03:41   ? ?ED COURSE and MDM  ?Nursing notes, initial and subsequent vitals signs, including pulse oximetry, reviewed and interpreted by myself. ? ?Vitals:  ? 12/31/21 0223 12/31/21 0226 12/31/21 0400 12/31/21 0445  ?BP:  128/76 110/84 121/70  ?Pulse:  (!) 101 92 90  ?Resp:  18 (!) 21 18  ?Temp:  99.4 ?F (37.4 ?C)    ?TempSrc:  Oral    ?SpO2:  98% 95% 96%  ?Weight: 64.4 kg     ?Height: 5\' 2"  (1.575 m)     ? ?Medications  ?sodium chloride 0.9 % bolus 1,000 mL (1,000 mLs Intravenous New Bag/Given 12/31/21 0250)  ?metoCLOPramide (REGLAN) injection 10 mg (10 mg Intravenous Given 12/31/21 0303)  ?pantoprazole (PROTONIX) injection 40 mg (40 mg Intravenous Given 12/31/21 0304)  ?ketorolac (TORADOL) 15 MG/ML injection 15 mg (15 mg Intravenous Given 12/31/21 0405)  ?cefTRIAXone (ROCEPHIN) 1 g in sodium chloride 0.9  % 100 mL IVPB (1 g Intravenous New Bag/Given 12/31/21 0408)  ?droperidol (INAPSINE) 2.5 MG/ML injection 1.25 mg (1.25 mg Intravenous Given 12/31/21 0437)  ? ?3:51 AM ?The patient's left flank pain with i

## 2021-12-31 NOTE — ED Triage Notes (Signed)
Pt reports headache, left sided chest pain as well as abd cramping and feeling hot. She reports she went to the [place] ER on Monday for her headache and it has not gone away. ?

## 2022-01-02 LAB — URINE CULTURE: Culture: >=100000 — AB

## 2022-01-03 ENCOUNTER — Telehealth (HOSPITAL_BASED_OUTPATIENT_CLINIC_OR_DEPARTMENT_OTHER): Payer: Self-pay | Admitting: *Deleted

## 2022-01-03 ENCOUNTER — Emergency Department (HOSPITAL_BASED_OUTPATIENT_CLINIC_OR_DEPARTMENT_OTHER)
Admission: EM | Admit: 2022-01-03 | Discharge: 2022-01-03 | Disposition: A | Discharge status: Home / Self Care | Payer: Self-pay | Attending: Emergency Medicine | Admitting: Emergency Medicine

## 2022-01-03 ENCOUNTER — Encounter (HOSPITAL_BASED_OUTPATIENT_CLINIC_OR_DEPARTMENT_OTHER): Payer: Self-pay

## 2022-01-03 ENCOUNTER — Other Ambulatory Visit: Payer: Self-pay

## 2022-01-03 DIAGNOSIS — J45901 Unspecified asthma with (acute) exacerbation: Secondary | ICD-10-CM | POA: Insufficient documentation

## 2022-01-03 DIAGNOSIS — N12 Tubulo-interstitial nephritis, not specified as acute or chronic: Secondary | ICD-10-CM | POA: Insufficient documentation

## 2022-01-03 MED ORDER — CIPROFLOXACIN HCL 500 MG PO TABS: 500.0000 mg | ORAL_TABLET | Freq: Two times a day (BID) | ORAL | 0 refills | Status: AC

## 2022-01-03 NOTE — ED Provider Notes (Signed)
?[facility] EMERGENCY DEPARTMENT ?Provider Note ? ?CSN: [id number] ?Arrival date & time: 01/03/22 4742 ? ?Chief Complaint(s) ?Abdominal Pain ? ?HPI ?Katherine Dominguez is a 31 y.o. female who was recently seen for migraines and pyelonephritis and prescribed cefpodoxime here requesting change of medication.  She reports that she is allergic to penicillin and was told by the pharmacist that cefpodoxime is in the penicillin family.  Patient was given Rocephin during her last visit.  She reports that she woke up later on the day with face swelling and hives which improved with Benadryl.  During that visit patient was also given droperidol.  She is not taken the cefpodoxime and needs new medication.  She denies any other physical complaints. ? ? ?Abdominal Pain ? ?Past Medical History ?Past Medical History:  ?Diagnosis Date  ? Asthma   ? Migraine   ? ?Patient Active Problem List  ? Diagnosis Date Noted  ? Asthma with acute exacerbation 03/22/2013  ? 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 Allergic rhinitis 03/22/2013  ? ?Home Medication(s) ?Prior to Admission medications   ?Medication Sig Start Date End Date Taking? Authorizing Provider  ?ciprofloxacin (CIPRO) 500 MG tablet Take 1 tablet (500 mg total) by mouth every 12 (twelve) hours for 7 days. 01/03/22 01/10/22 Yes Chelsie Burel, Amadeo Garnet, MD  ?                                                                                                                                  ?Allergies ?Doxycycline and Penicillins ? ?Review of Systems ?Review of Systems  ?Gastrointestinal:  Positive for abdominal pain.  ?As noted in HPI ? ?Physical Exam ?Vital Signs  ?I have reviewed the triage vital signs ?BP (!) 153/83 (BP Location: Left Arm)   Pulse 64   Temp 98.1 ?F (36.7 ?C) (Oral)   Resp 18   Ht 5\' 2"  (1.575 m)   Wt 64.4 kg   LMP 12/14/2021 (Approximate)   SpO2 100%   BMI 25.97 kg/m?  ? ?Physical Exam ?Vitals reviewed.  ?Constitutional:   ?   General: She is not in acute  distress. ?   Appearance: She is well-developed. She is not diaphoretic.  ?HENT:  ?   Head: Normocephalic and atraumatic.  ?   Right Ear: External ear normal.  ?   Left Ear: External ear normal.  ?   Nose: Nose normal.  ?Eyes:  ?   General: No scleral icterus. ?   Conjunctiva/sclera: Conjunctivae normal.  ?Neck:  ?   Trachea: Phonation normal.  ?Cardiovascular:  ?   Rate and Rhythm: Normal rate and regular rhythm.  ?Pulmonary:  ?   Effort: Pulmonary effort is normal. No respiratory distress.  ?   Breath sounds: No stridor.  ?Abdominal:  ?   General: There is no distension.  ?Musculoskeletal:     ?   General: Normal range of motion.  ?   Cervical back: Normal range of  motion.  ?Neurological:  ?   Mental Status: She is alert and oriented to person, place, and time.  ?Psychiatric:     ?   Behavior: Behavior normal.  ? ? ?ED Results and Treatments ?Labs ?(all labs ordered are listed, but only abnormal results are displayed) ?Labs Reviewed - No data to display                                                                                                                       ?EKG ? EKG Interpretation ? ?Date/Time:    ?Ventricular Rate:    ?PR Interval:    ?QRS Duration:   ?QT Interval:    ?QTC Calculation:   ?R Axis:     ?Text Interpretation:   ?  ? ?  ? ?Radiology ?No results found. ? ?Pertinent labs & imaging results that were available during my care of the patient were reviewed by me and considered in my medical decision making (see MDM for details). ? ?Medications Ordered in ED ?Medications - No data to display                                                               ?                                                                    ?Procedures ?Procedures ? ?(including critical care time) ? ?Medical Decision Making / ED Course ? ? ?   ?ED Course:   ? ?Assessment, Add'l Intervention, and Reassessment: ?Pyelo ?Will rx Cipro  ? ? ?Final Clinical Impression(s) / ED Diagnoses ?Final diagnoses:  ?Pyelonephritis   ? ?The patient appears reasonably screened and/or stabilized for discharge and I doubt any other medical condition or other [facility] requiring further screening, evaluation, or treatment in the ED at this time prior to discharge. Safe for discharge with strict return precautions. ? ?Disposition: Discharge ? ?Condition: Good ? ?I have discussed the results, Dx and Tx plan with the patient/family who expressed understanding and agree(s) with the plan. Discharge instructions discussed at length. The patient/family was given strict return precautions who verbalized understanding of the instructions. No further questions at time of discharge.  ? ? ?ED Discharge Orders   ? ?      Ordered  ?  ciprofloxacin (CIPRO) 500 MG tablet  Every 12 hours       ? 01/03/22 0631  ? ?  ?  ? ?  ? ? ? ?  ? ? ? ? ? ?This chart was dictated  using voice recognition software.  Despite best efforts to proofread,  errors can occur which can change the documentation meaning. ? ?  ?Nira Conn, MD ?01/03/22 [phone] ? ?

## 2022-01-03 NOTE — Telephone Encounter (Signed)
Post ED Visit - Positive Culture Follow-up ? ?Culture report reviewed by antimicrobial stewardship pharmacist: ?[facility] Team ?[]  Elenor Quinones, Pharm.D. ?[]  Heide Guile, Pharm.D., BCPS AQ-ID ?[]  Parks Neptune, Pharm.D., BCPS ?[]  Alycia Rossetti, Pharm.D., BCPS ?[]  [place], Pharm.D., BCPS, AAHIVP ?[]  Legrand Como, Pharm.D., BCPS, AAHIVP ?[]  Salome Arnt, PharmD, BCPS ?[]  Johnnette Gourd, PharmD, BCPS ?[]  Hughes Better, PharmD, BCPS ?[]  Leeroy Cha, PharmD ?[]  Laqueta Linden, PharmD, BCPS ?[x]  Albertina Parr, PharmD ? ?[place] Team ?[]  Leodis Sias, PharmD ?[]  Lindell Spar, PharmD ?[]  Royetta Asal, PharmD ?[]  Graylin Shiver, Rph ?[]  Rema Fendt) Glennon Mac, PharmD ?[]  Arlyn Dunning, PharmD ?[]  Netta Cedars, PharmD ?[]  Dia Sitter, PharmD ?[]  Leone Haven, PharmD ?[]  Gretta Arab, PharmD ?[]  Theodis Shove, PharmD ?[]  Peggyann Juba, PharmD ?[]  Reuel Boom, PharmD ? ? ?Positive urine culture ?Treated with Cefpodoxime Proxetil, organism sensitive to the same and no further patient follow-up is required at this time. ? ?Katherine Dominguez ?01/03/2022, 11:21 AM ?  ?

## 2022-01-03 NOTE — ED Triage Notes (Signed)
Pt seen 12/31/21 for abdominal pain. Dx with Pyelonephritis. Prescribed Cefpodoxime. States she didn't want to take medication because she was concerned about being allergic to it.  ? ?Abdominal pain ?Back pain ?Urinary frequency ? ?

## 2022-06-09 ENCOUNTER — Emergency Department (HOSPITAL_BASED_OUTPATIENT_CLINIC_OR_DEPARTMENT_OTHER): Payer: Self-pay

## 2022-06-09 ENCOUNTER — Emergency Department (HOSPITAL_BASED_OUTPATIENT_CLINIC_OR_DEPARTMENT_OTHER)
Admission: EM | Admit: 2022-06-09 | Discharge: 2022-06-09 | Disposition: A | Discharge status: Home / Self Care | Payer: Self-pay | Attending: Emergency Medicine | Admitting: Emergency Medicine

## 2022-06-09 ENCOUNTER — Encounter (HOSPITAL_BASED_OUTPATIENT_CLINIC_OR_DEPARTMENT_OTHER): Payer: Self-pay | Admitting: Emergency Medicine

## 2022-06-09 ENCOUNTER — Other Ambulatory Visit: Payer: Self-pay

## 2022-06-09 DIAGNOSIS — R197 Diarrhea, unspecified: Secondary | ICD-10-CM | POA: Insufficient documentation

## 2022-06-09 DIAGNOSIS — O99891 Other specified diseases and conditions complicating pregnancy: Secondary | ICD-10-CM | POA: Insufficient documentation

## 2022-06-09 DIAGNOSIS — J45909 Unspecified asthma, uncomplicated: Secondary | ICD-10-CM | POA: Insufficient documentation

## 2022-06-09 DIAGNOSIS — O219 Vomiting of pregnancy, unspecified: Secondary | ICD-10-CM | POA: Insufficient documentation

## 2022-06-09 DIAGNOSIS — O99511 Diseases of the respiratory system complicating pregnancy, first trimester: Secondary | ICD-10-CM | POA: Insufficient documentation

## 2022-06-09 DIAGNOSIS — Z3A01 Less than 8 weeks gestation of pregnancy: Secondary | ICD-10-CM | POA: Insufficient documentation

## 2022-06-09 LAB — HCG, QUANTITATIVE, PREGNANCY: hCG, Beta Chain, Quant, S: 1731 m[IU]/mL — ABNORMAL HIGH (ref <5)

## 2022-06-09 NOTE — ED Provider Notes (Signed)
[facility] Provider Note   CSN: [id number] Arrival date & time: 06/09/22  1720     History  Chief Complaint  Patient presents with   Nausea    3 ws preg    Katherine Dominguez is a 31 y.o. female.  Patient last menstrual period was September 20.  Patient was at [facility] in [place] on Saturday and they gave her pills to take for 3 days.  Patient does not know what she was given to take.  Does not have any paperwork on that.  Patient had some vomiting and diarrhea today.  No abdominal pain no bleeding.  Now feels better.  Patient did have an ultrasound prior to prior to being given these pills and it was shown to be an intrauterine pregnancy.  Past medical history significant for past history of an ectopic pregnancy requiring surgery history of migraines and asthma.       Home Medications Prior to Admission medications   Not on File      Allergies    Doxycycline and Penicillins    Review of Systems   Review of Systems  Constitutional:  Negative for chills and fever.  HENT:  Negative for ear pain and sore throat.   Eyes:  Negative for pain and visual disturbance.  Respiratory:  Negative for cough and shortness of breath.   Cardiovascular:  Negative for chest pain and palpitations.  Gastrointestinal:  Positive for diarrhea, nausea and vomiting. Negative for abdominal pain.  Genitourinary:  Negative for dysuria, hematuria, pelvic pain and vaginal bleeding.  Musculoskeletal:  Negative for arthralgias and back pain.  Skin:  Negative for color change and rash.  Neurological:  Negative for seizures and syncope.  All other systems reviewed and are negative.   Physical Exam Updated Vital Signs BP (!) 179/92   Pulse 75   Temp 98.2 F (36.8 C) (Oral)   Resp 16   Ht 1.575 m (5\' 2" )   Wt 65.8 kg   LMP 05/13/2022   SpO2 100%   BMI 26.52 kg/m  Physical Exam Vitals and nursing note reviewed.  Constitutional:      General: She is  not in acute distress.    Appearance: Normal appearance. She is well-developed.  HENT:     Head: Normocephalic and atraumatic.  Eyes:     Conjunctiva/sclera: Conjunctivae normal.     Pupils: Pupils are equal, round, and reactive to light.  Cardiovascular:     Rate and Rhythm: Normal rate and regular rhythm.     Heart sounds: No murmur heard. Pulmonary:     Effort: Pulmonary effort is normal. No respiratory distress.     Breath sounds: Normal breath sounds.  Abdominal:     General: There is no distension.     Palpations: Abdomen is soft.     Tenderness: There is no abdominal tenderness. There is no guarding.  Musculoskeletal:        General: No swelling.     Cervical back: Normal range of motion and neck supple.     Right lower leg: No edema.     Left lower leg: No edema.  Skin:    General: Skin is warm and dry.     Capillary Refill: Capillary refill takes less than 2 seconds.  Neurological:     General: No focal deficit present.     Mental Status: She is alert and oriented to person, place, and time.  Psychiatric:  Mood and Affect: Mood normal.     ED Results / Procedures / Treatments   Labs (all labs ordered are listed, but only abnormal results are displayed) Labs Reviewed  HCG, QUANTITATIVE, PREGNANCY - Abnormal; Notable for the following components:      Result Value   hCG, Beta Chain, Quant, S 1,731 (*)    All other components within normal limits    EKG None  Radiology US OB LESS THAN 14 WEEKS WITH OB TRANSVAGINAL  Result Date: 06/09/2022 CLINICAL DATA:  History of ectopic. EXAM: OBSTETRIC <14 WK [place] AND TRANSVAGINAL OB US TECHNIQUE: Both transabdominal and transvaginal ultrasound examinations were performed for complete evaluation of the gestation as well as the maternal uterus, adnexal regions, and pelvic cul-de-sac. Transvaginal technique was performed to assess early pregnancy. COMPARISON:  None Available. FINDINGS: Intrauterine gestational sac: Single  Yolk sac:  Not Visualized. Embryo:  Not Visualized. Cardiac Activity: Not Visualized. Heart Rate:   bpm MSD: 2.2 mm   4 w   6 d CRL:    mm    w    d                  [place] EDC: Subchorionic hemorrhage:  None visualized. Maternal uterus/adnexae: No adnexal mass or free fluid. Right corpus luteal cyst. IMPRESSION: Probable early intrauterine gestational sac, 4 weeks 6 days by mean sac diameter. Recommend follow-up quantitative B-HCG levels and follow-up US in 14 days to assess viability. This recommendation follows SRU consensus guidelines: Diagnostic Criteria for Nonviable Pregnancy Early in the First Trimester. Malva Limes Med 2013; 161:0960-45. Electronically Signed   By: Charlett Nose M.D.   On: 06/09/2022 20:57    Procedures Procedures    Medications Ordered in ED Medications - No data to display  ED Course/ Medical Decision Making/ A&P                           Medical Decision Making Amount and/or Complexity of Data Reviewed Labs: ordered.   Patient's quantitative hCG here is 1731.  Ultrasound and vaginal ultrasound shows probable early intrauterine gestational sac 4 weeks 6 days.  Not able to assess viability.  We will have patient contact [facility] in [place] regarding what ever medication regiment they put her on for the abortion.  Patient nontoxic no acute distress.  Not sure what the expectations were with the medicine.  But patient is nontoxic no acute distress.   Final Clinical Impression(s) / ED Diagnoses Final diagnoses:  Less than [redacted] weeks gestation of pregnancy    Rx / DC Orders ED Discharge Orders     None         Vanetta Mulders, MD 06/09/22 2258

## 2022-06-09 NOTE — Discharge Instructions (Addendum)
Contact [facility] in [place] tomorrow to get clarification on the medication they gave you and the expectations.  Definitely get seen in for any abdominal pain or vaginal bleeding.  Your quantitative hCG here today was 1731.  Ultrasound showed intrauterine gestational sac not able to determine viability.  The quantitative hCG should drop over time, if the pregnancy is in demise.

## 2022-06-09 NOTE — ED Triage Notes (Signed)
Reports Nausea and vomiting , 3-[redacted] weeks pregnant , took Misoprostol 2 days ago for  abortion ,  Denies abd pain , yet reports Hx ectopic pregnancy 2020. Denies vaginal bleeding
